# Patient Record
Sex: Male | Born: 1955
Health system: Southern US, Community
[De-identification: ages and names within clinical notes are randomized; demographics above are authoritative.]

## PROBLEM LIST (undated history)

## (undated) DIAGNOSIS — N400 Enlarged prostate without lower urinary tract symptoms: Secondary | ICD-10-CM

## (undated) DIAGNOSIS — F329 Major depressive disorder, single episode, unspecified: Secondary | ICD-10-CM

## (undated) DIAGNOSIS — F988 Other specified behavioral and emotional disorders with onset usually occurring in childhood and adolescence: Secondary | ICD-10-CM

## (undated) DIAGNOSIS — R569 Unspecified convulsions: Secondary | ICD-10-CM

## (undated) DIAGNOSIS — F32A Depression, unspecified: Secondary | ICD-10-CM

## (undated) HISTORY — PX: APPENDECTOMY: SHX54

---

## 2009-02-07 ENCOUNTER — Encounter
Admission: RE | Admit: 2009-02-07 | Discharge: 2009-02-07 | Payer: Self-pay | Admitting: Physical Medicine and Rehabilitation

## 2009-09-14 ENCOUNTER — Observation Stay (HOSPITAL_COMMUNITY): Admission: AD | Admit: 2009-09-14 | Discharge: 2009-09-15 | Payer: Self-pay | Admitting: Internal Medicine

## 2010-09-02 IMAGING — CR DG CHEST 1V
1 series · 1 of 1 positions shown · non-contrast
Comparison: No priors

CLINICAL DATA: Routine physical

CHEST - 1 VIEW

[view not recorded]
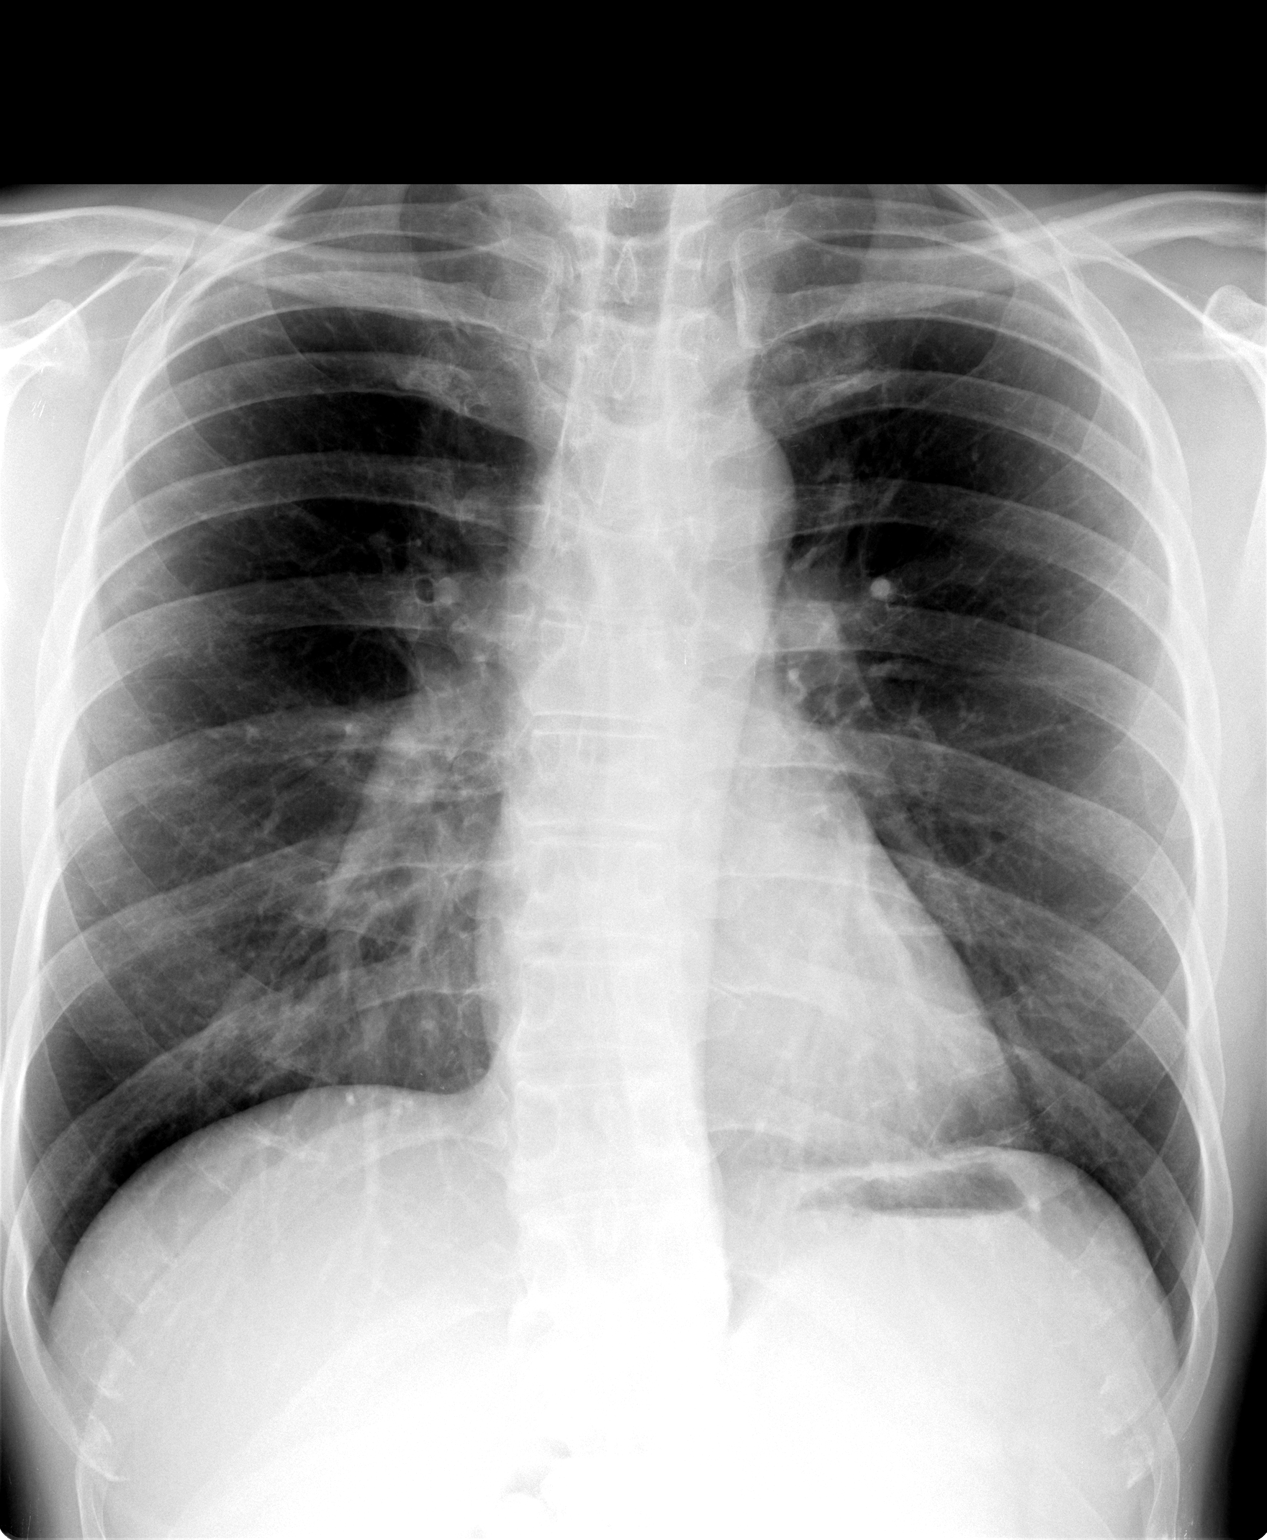

[1 of 1 positions shown; findings below may reference images not displayed]

FINDINGS: Heart and mediastinal contours normal.  Lungs clear.
Osseous structures intact with mild biconcave thoracolumbar
scoliosis.
IMPRESSION: No active disease.

## 2011-03-30 LAB — COMPREHENSIVE METABOLIC PANEL
ALT: 17 U/L (ref 0–53)
AST: 23 U/L (ref 0–37)
Albumin: 3.9 g/dL (ref 3.5–5.2)
Alkaline Phosphatase: 25 U/L — ABNORMAL LOW (ref 39–117)
BUN: 16 mg/dL (ref 6–23)
CO2: 30 mEq/L (ref 19–32)
Calcium: 9 mg/dL (ref 8.4–10.5)
Chloride: 103 mEq/L (ref 96–112)
Creatinine, Ser: 1.06 mg/dL (ref 0.4–1.5)
GFR calc Af Amer: >60 mL/min (ref >60)
GFR calc non Af Amer: >60 mL/min (ref >60)
Glucose, Bld: 117 mg/dL — ABNORMAL HIGH (ref 70–99)
Potassium: 4.1 mEq/L (ref 3.5–5.1)
Sodium: 137 mEq/L (ref 135–145)
Total Bilirubin: 0.6 mg/dL (ref 0.3–1.2)
Total Protein: 6.5 g/dL (ref 6.0–8.3)

## 2011-03-30 LAB — CARDIAC PANEL(CRET KIN+CKTOT+MB+TROPI)
CK, MB: 1.1 ng/mL (ref 0.3–4.0)
CK, MB: 1.1 ng/mL (ref 0.3–4.0)
Relative Index: INVALID (ref 0.0–2.5)
Relative Index: INVALID (ref 0.0–2.5)
Total CK: 62 U/L (ref 7–232)
Total CK: 67 U/L (ref 7–232)
Troponin I: 0.01 ng/mL (ref 0.00–0.06)
Troponin I: 0.01 ng/mL (ref 0.00–0.06)

## 2011-03-30 LAB — CBC
HCT: 34.4 % — ABNORMAL LOW (ref 39.0–52.0)
Hemoglobin: 11.7 g/dL — ABNORMAL LOW (ref 13.0–17.0)
MCHC: 34.1 g/dL (ref 30.0–36.0)
MCV: 98.7 fL (ref 78.0–100.0)
Platelets: 143 10*3/uL — ABNORMAL LOW (ref 150–400)
RBC: 3.48 MIL/uL — ABNORMAL LOW (ref 4.22–5.81)
RDW: 13.2 % (ref 11.5–15.5)
WBC: 4.3 10*3/uL (ref 4.0–10.5)

## 2012-07-10 ENCOUNTER — Emergency Department (HOSPITAL_COMMUNITY)
Admission: EM | Admit: 2012-07-10 | Discharge: 2012-07-10 | Disposition: A | Discharge status: Home / Self Care | Payer: 59 | Attending: Emergency Medicine | Admitting: Emergency Medicine

## 2012-07-10 ENCOUNTER — Encounter (HOSPITAL_COMMUNITY): Payer: Self-pay | Admitting: Family Medicine

## 2012-07-10 DIAGNOSIS — T50905A Adverse effect of unspecified drugs, medicaments and biological substances, initial encounter: Secondary | ICD-10-CM

## 2012-07-10 DIAGNOSIS — T43205A Adverse effect of unspecified antidepressants, initial encounter: Secondary | ICD-10-CM | POA: Insufficient documentation

## 2012-07-10 DIAGNOSIS — R252 Cramp and spasm: Secondary | ICD-10-CM | POA: Insufficient documentation

## 2012-07-10 DIAGNOSIS — F329 Major depressive disorder, single episode, unspecified: Secondary | ICD-10-CM

## 2012-07-10 DIAGNOSIS — Z79899 Other long term (current) drug therapy: Secondary | ICD-10-CM | POA: Insufficient documentation

## 2012-07-10 DIAGNOSIS — T887XXA Unspecified adverse effect of drug or medicament, initial encounter: Secondary | ICD-10-CM | POA: Insufficient documentation

## 2012-07-10 DIAGNOSIS — F419 Anxiety disorder, unspecified: Secondary | ICD-10-CM

## 2012-07-10 DIAGNOSIS — Z9089 Acquired absence of other organs: Secondary | ICD-10-CM | POA: Insufficient documentation

## 2012-07-10 DIAGNOSIS — F32A Depression, unspecified: Secondary | ICD-10-CM

## 2012-07-10 DIAGNOSIS — F341 Dysthymic disorder: Secondary | ICD-10-CM | POA: Insufficient documentation

## 2012-07-10 HISTORY — DX: Other specified behavioral and emotional disorders with onset usually occurring in childhood and adolescence: F98.8

## 2012-07-10 HISTORY — DX: Depression, unspecified: F32.A

## 2012-07-10 HISTORY — DX: Benign prostatic hyperplasia without lower urinary tract symptoms: N40.0

## 2012-07-10 HISTORY — DX: Unspecified convulsions: R56.9

## 2012-07-10 HISTORY — DX: Major depressive disorder, single episode, unspecified: F32.9

## 2012-07-10 NOTE — ED Provider Notes (Signed)
History     CSN: [id number]  Arrival date & time 07/10/12  1710   None     Chief Complaint  Patient presents with  . leg cramping    . Depression  . Anxiety    (Consider location/radiation/quality/duration/timing/severity/associated sxs/prior treatment) HPI Comments: Jeffrey [name] is a 56 y.o. Male who presents with a 2 week history of worsening depression and anxiety.  He has a long history of same and has previously been treated with Celexa and Lexapro.  He sees Oneta Rack, NP at [facility] in [place].  3 weeks ago he was switched from the Lexapro to Prozac 20mg  BID.  He states that his anxiety and depression have increased significantly since then, interfering with his ability to work and interact socially.  He denies SI/HI but feels very frustrated with his significant increase in symptoms.  He is sleeping 16-18 hours per day and has lost his appetite.  He also c/o his arms and legs feeling very heavy and difficulty to move.  He states he has no motivation to get out of bed or do anything during the day.  His ex-wife states that he is much more sullen and withdrawn than usual.     Patient is a 56 y.o. male presenting with anxiety. The history is provided by the patient and medical records (the ex-wife). History Limited By: none.  Anxiety This is a chronic problem. The current episode started 1 to 4 weeks ago. The problem occurs daily. The problem has been rapidly worsening. Associated symptoms include anorexia. Pertinent negatives include no abdominal pain, chest pain, congestion, coughing, diaphoresis, fatigue, fever, headaches, nausea, rash or vomiting. The symptoms are aggravated by stress.    Past Medical History  Diagnosis Date  . Seizures   . Depression   . ADD (attention deficit disorder)   . Benign prostatic hypertrophy     Past Surgical History  Procedure Date  . Appendectomy     History reviewed. No pertinent family history.  History    Substance Use Topics  . Smoking status: Never Smoker   . Smokeless tobacco: Not on file  . Alcohol Use: Yes     rarely      Review of Systems  Constitutional: Positive for activity change (decreased) and appetite change (decreased). Negative for fever, diaphoresis, fatigue and unexpected weight change.  HENT: Negative for hearing loss, ear pain, congestion, mouth sores and voice change.   Eyes: Negative for visual disturbance.  Respiratory: Negative for cough, chest tightness, shortness of breath and wheezing.   Cardiovascular: Negative for chest pain.  Gastrointestinal: Positive for anorexia. Negative for nausea, vomiting, abdominal pain, diarrhea and constipation.  Genitourinary: Negative for dysuria, urgency, frequency and hematuria.  Musculoskeletal: Negative for back pain.  Skin: Negative for rash.  Neurological: Negative for syncope, light-headedness and headaches.  Hematological: Does not bruise/bleed easily.  Psychiatric/Behavioral: Positive for disturbed wake/sleep cycle (sleep 16-18 hrs per day). Negative for suicidal ideas, hallucinations, confusion, self-injury and agitation. The patient is not nervous/anxious.        Anxiety, depression, hopeless feeling, feeling reclusive    Allergies  Review of patient's allergies indicates no known allergies.  Home Medications   Current Outpatient Rx  Name Route Sig Dispense Refill  . AMPHETAMINE-DEXTROAMPHETAMINE 20 MG PO TABS Oral Take 10 mg by mouth 4 (four) times daily.    Marland Kitchen FLUOXETINE HCL 20 MG PO TABS Oral Take 20 mg by mouth 2 (two) times daily.    . ADULT MULTIVITAMIN  W/MINERALS CH Oral Take 1 tablet by mouth daily.    Marland Kitchen PHENYTOIN SODIUM EXTENDED 100 MG PO CAPS Oral Take 100-200 mg by mouth 2 (two) times daily. 1 in am, 2 in pm    . TAMSULOSIN HCL 0.4 MG PO CAPS Oral Take 0.4 mg by mouth daily.      BP 117/74  Pulse 93  Temp 98.1 F (36.7 C) (Oral)  Resp 18  SpO2 99%  Physical Exam  Nursing note and vitals  reviewed. Constitutional: He is oriented to person, place, and time. He appears well-developed and well-nourished. No distress.  HENT:  Head: Normocephalic and atraumatic.  Mouth/Throat: Oropharynx is clear and moist. No oropharyngeal exudate.  Eyes: Conjunctivae and EOM are normal. Pupils are equal, round, and reactive to light. No scleral icterus.  Neck: Normal range of motion. Neck supple.  Cardiovascular: Normal rate, regular rhythm, normal heart sounds and intact distal pulses.   Pulmonary/Chest: Effort normal and breath sounds normal. No respiratory distress. He has no wheezes.  Abdominal: Soft. Bowel sounds are normal. He exhibits no mass. There is no tenderness. There is no rebound and no guarding.  Musculoskeletal: Normal range of motion.  Neurological: He is alert and oriented to person, place, and time.  Skin: Skin is warm and dry. He is not diaphoretic.  Psychiatric: Judgment and thought content normal. His mood appears anxious. His affect is not angry and not labile. His speech is delayed. He is withdrawn. He is not agitated, not aggressive and not actively hallucinating. Thought content is not delusional. He exhibits a depressed mood. He expresses no homicidal and no suicidal ideation. He expresses no suicidal plans and no homicidal plans.       Speech is soft and effortful.  He does not make eye contact during the interview but is cooperative.      ED Course  Procedures (including critical care time)  Labs Reviewed - No data to display No results found.   1. Depression   2. Anxiety   3. Medication adverse effect       MDM  Jeffrey [name] has been stable while in the ER.  He continues to deny SI/HI.  Based on the recent medication change, I feel the Prozac has exacerbated the depression and anxiety symptoms.  He has already scheduled and appointment with Oneta Rack for Tuesday July 14, 2012 and I feel this is the appropriate follow up for medication management.  He  is willing to cut the Prozac dose in 1/2 and take 1 tab QD or 1 tab every other day to avoid any negative side effects from stopping the medication.  I have advised the patient that he needs to return to the ER if the depression worsens or he begins to have SI/HI concerns.  He expresses understanding.          Dahlia Client Ohn Bostic, PA-C 07/10/12 2015

## 2012-07-10 NOTE — ED Notes (Signed)
Hannah, PA at bedside  

## 2012-07-10 NOTE — ED Notes (Signed)
Started on Prozac 3 weeks ago, had been on celexa and lexapro, now states is more anxious and depressed since starting on med. Legs feel heavy and like they are cramping

## 2012-07-10 NOTE — ED Provider Notes (Signed)
Medical screening examination/treatment/procedure(s) were conducted as a shared visit with non-physician practitioner(s) and myself.  I personally evaluated the patient during the encounter.   Pt is alert and cooperative. He denies SI.   Plan is to wean off Prozac and see Psychiatry in 5 days. Return here prn.  Flint Melter, MD 07/10/12 2012

## 2012-07-10 NOTE — ED Notes (Addendum)
Pt reports being started on prozac x3 weeks ago. Reports 2 days ago began having swelling and cramping to both legs.  No swelling noted. Reports it is a "sensation" of swelling. Reports he has been sleeping a lot.  Denies SI/HI.  NAD noted at this time.

## 2012-07-11 NOTE — ED Provider Notes (Signed)
Alert cooperative, and obviously depressed. Patient without suicidal ideation. He stable for discharge.  Medical screening examination/treatment/procedure(s) were conducted as a shared visit with non-physician practitioner(s) and myself.  I personally evaluated the patient during the encounter  Flint Melter, MD 07/11/12 [phone]

## 2013-08-21 ENCOUNTER — Other Ambulatory Visit: Payer: Self-pay | Admitting: Neurology

## 2013-10-26 ENCOUNTER — Encounter (INDEPENDENT_AMBULATORY_CARE_PROVIDER_SITE_OTHER): Payer: Self-pay

## 2013-10-26 ENCOUNTER — Encounter: Payer: Self-pay | Admitting: Nurse Practitioner

## 2013-10-26 ENCOUNTER — Ambulatory Visit (INDEPENDENT_AMBULATORY_CARE_PROVIDER_SITE_OTHER): Payer: 59 | Admitting: Nurse Practitioner

## 2013-10-26 VITALS — BP 121/77 | HR 72 | Ht 74.5 in | Wt 198.0 lb

## 2013-10-26 DIAGNOSIS — Z5181 Encounter for therapeutic drug level monitoring: Secondary | ICD-10-CM

## 2013-10-26 DIAGNOSIS — G40309 Generalized idiopathic epilepsy and epileptic syndromes, not intractable, without status epilepticus: Secondary | ICD-10-CM

## 2013-10-26 MED ORDER — PHENYTOIN SODIUM EXTENDED 100 MG PO CAPS: 300.0000 mg | ORAL_CAPSULE | Freq: Every day | ORAL | Status: DC

## 2013-10-26 NOTE — Progress Notes (Signed)
I have read the note, and I agree with the clinical assessment and plan.  Jeffrey [name],Jeffrey [name]   

## 2013-10-26 NOTE — Patient Instructions (Signed)
Will check labs today Will refill Dilantin for one year Followup yearly and when necessary  Call for any seizure activity

## 2013-10-26 NOTE — Progress Notes (Signed)
[facility]  PATIENT: Jeffrey [name] DOB: April 08, 1956   REASON FOR VISIT: Followup for seizure disorder    HISTORY OF PRESENT ILLNESS:  Mr. Dumlao, 57 year old right-handed white male with a history of seizures and an anxiety disorder. The patient had some issues with anxiety since May of 2013. The patient was switched from  Prozac to Celexa. He sees Donnie Aho every 3 months. The patient indicates that he has done well with the seizures, and he has not had any recurrence in over 16 years. The patient remains on Dilantin, and he is tolerating the medication well without side effects. No problems with his gums.     REVIEW OF SYSTEMS: Full 14 system review of systems performed and notable only for:  Constitutional: N/A  Cardiovascular: N/A  Ear/Nose/Throat: N/A  Skin: N/A  Eyes: N/A  Respiratory: N/A  Gastroitestinal: N/A  Hematology/Lymphatic: N/A  Endocrine: N/A Musculoskeletal:N/A  Allergy/Immunology: N/A  Neurological: N/A Psychiatric: Depression  ALLERGIES: No Known Allergies  HOME MEDICATIONS: Outpatient Prescriptions Prior to Visit  Medication Sig Dispense Refill  . amphetamine-dextroamphetamine (ADDERALL) 20 MG tablet Take 10 mg by mouth 3 (three) times daily.       Marland Kitchen FLUoxetine (PROZAC) 20 MG tablet Take 20 mg by mouth 2 (two) times daily.      . Multiple Vitamin (MULTIVITAMIN WITH MINERALS) TABS Take 1 tablet by mouth daily.      . phenytoin (DILANTIN) 100 MG ER capsule TAKE THREE CAPSULES AT NIGHT  90 capsule  2  . Tamsulosin HCl (FLOMAX) 0.4 MG CAPS Take 0.4 mg by mouth daily.       No facility-administered medications prior to visit.    PAST MEDICAL HISTORY: Past Medical History  Diagnosis Date  . Seizures   . Depression   . ADD (attention deficit disorder)   . Benign prostatic hypertrophy     PAST SURGICAL HISTORY: Past Surgical History  Procedure Laterality Date  . Appendectomy    Knee surgery 2013  FAMILY  HISTORY: Family History  Problem Relation Age of Onset  . Prostate cancer Father   . Seizures Father   . Stroke Sister   . Seizures Son     SOCIAL HISTORY: History   Social History  . Marital Status: Divorced    Spouse Name: N/A    Number of Children: 2  . Years of Education: N/A   Occupational History  .  [employer]   Social History Main Topics  . Smoking status: Former Smoker    Types: Cigarettes  . Smokeless tobacco: Never Used     Comment: Quit 1977  . Alcohol Use: Yes     Comment: rarely  . Drug Use: No  . Sexual Activity: Not on file   Other Topics Concern  . Not on file   Social History Narrative   Patient is divorced and works for [employer].    Education- college   Left handed.   Caffeine- None     PHYSICAL EXAM  Filed Vitals:   10/26/13 0918  BP: 121/77  Pulse: 72  Height: 6' 2.5" (1.892 m)  Weight: 198 lb (89.812 kg)   Body mass index is 25.09 kg/(m^2).  Generalized: Well developed, in no acute distress  Neurological examination   Mentation: Alert oriented to time, place, history taking. Follows all commands speech and language fluent  Cranial nerve II-XII: Pupils were equal round reactive to light extraocular movements were full, visual field were full on  confrontational test. Facial sensation and strength were normal. hearing was intact to finger rubbing bilaterally. Uvula tongue midline. head turning and shoulder shrug and were normal and symmetric.Tongue protrusion into cheek strength was normal. Motor: normal bulk and tone, full strength in the BUE, BLE, fine finger movements normal, no pronator drift. No focal weakness Coordination: finger-nose-finger, heel-to-shin bilaterally, no dysmetria Reflexes: Brachioradialis 2/2, biceps 2/2, triceps 2/2, patellar 2/2, Achilles 2/2, plantar responses were flexor bilaterally. Gait and Station: Rising up from seated position without assistance, normal stance,  moderate stride, good arm  swing, smooth turning, able to perform tiptoe, and heel walking without difficulty. Tandem gait mildly unsteady, Romberg negative  DIAGNOSTIC DATA (LABS, IMAGING, TESTING) -None to review    ASSESSMENT AND PLAN  57 y.o. year old male  has a past medical history of Seizures; Depression; ADD (attention deficit disorder); and Benign prostatic hypertrophy. here to followup for seizure disorder. Currently on Dilantin with no seizures in 16 years.   Will check labs today, CBC, CMP and Dilantin level not trough Will refill Dilantin for one year Followup yearly and when necessary  Call for any seizure activity Nilda Riggs, [facility], [facility], APRN  [facility] [address], [address] [place], [place] [address] [phone]

## 2013-10-27 LAB — CBC WITH DIFFERENTIAL/PLATELET
Eos: 3 %
HCT: 39.8 % (ref 37.5–51.0)
Hemoglobin: 13.7 g/dL (ref 12.6–17.7)
Lymphocytes Absolute: 1.7 10*3/uL (ref 0.7–3.1)
MCHC: 34.4 g/dL (ref 31.5–35.7)
MCV: 94 fL (ref 79–97)
Monocytes Absolute: 0.5 10*3/uL (ref 0.1–0.9)
Monocytes: 9 %
Neutrophils Absolute: 3.1 10*3/uL (ref 1.4–7.0)
RDW: 12.8 % (ref 12.3–15.4)
WBC: 5.5 10*3/uL (ref 3.4–10.8)

## 2013-10-27 LAB — COMPREHENSIVE METABOLIC PANEL
ALT: 23 IU/L (ref 0–44)
AST: 22 IU/L (ref 0–40)
Albumin/Globulin Ratio: 1.9 (ref 1.1–2.5)
Alkaline Phosphatase: 52 IU/L (ref 39–117)
Chloride: 101 mmol/L (ref 96–108)
GFR calc Af Amer: 96 mL/min/{1.73_m2} (ref >59)
GFR calc non Af Amer: 83 mL/min/{1.73_m2} (ref >59)
Glucose: 85 mg/dL (ref 65–99)
Potassium: 4.3 mmol/L (ref 3.5–5.2)
Sodium: 138 mmol/L (ref 134–144)
Total Bilirubin: 0.2 mg/dL (ref 0.0–1.2)

## 2013-10-27 LAB — PHENYTOIN LEVEL, TOTAL: Phenytoin Lvl: 11 ug/mL (ref 10.0–20.0)

## 2013-10-27 NOTE — Progress Notes (Signed)
Quick Note:  Shared lab results with patient thru VM message ______ 

## 2013-10-29 ENCOUNTER — Other Ambulatory Visit: Payer: Self-pay

## 2013-11-20 ENCOUNTER — Other Ambulatory Visit: Payer: Self-pay | Admitting: Neurology

## 2014-09-27 ENCOUNTER — Other Ambulatory Visit: Payer: Self-pay

## 2014-09-27 MED ORDER — PHENYTOIN SODIUM EXTENDED 100 MG PO CAPS: 300.0000 mg | ORAL_CAPSULE | Freq: Every day | ORAL | Status: DC

## 2014-10-26 ENCOUNTER — Ambulatory Visit: Payer: Self-pay | Admitting: Adult Health

## 2014-11-08 ENCOUNTER — Encounter: Payer: Self-pay | Admitting: Adult Health

## 2014-11-08 ENCOUNTER — Ambulatory Visit (INDEPENDENT_AMBULATORY_CARE_PROVIDER_SITE_OTHER): Payer: 59 | Admitting: Adult Health

## 2014-11-08 VITALS — BP 123/78 | HR 84 | Temp 98.0°F | Ht 73.5 in | Wt 202.4 lb

## 2014-11-08 DIAGNOSIS — Z5181 Encounter for therapeutic drug level monitoring: Secondary | ICD-10-CM

## 2014-11-08 DIAGNOSIS — G40309 Generalized idiopathic epilepsy and epileptic syndromes, not intractable, without status epilepticus: Secondary | ICD-10-CM

## 2014-11-08 MED ORDER — PHENYTOIN SODIUM EXTENDED 100 MG PO CAPS: 300.0000 mg | ORAL_CAPSULE | Freq: Every day | ORAL | Status: DC

## 2014-11-08 NOTE — Patient Instructions (Signed)
Seizure, Adult A seizure means there is unusual activity in the brain. A seizure can cause changes in attention or behavior. Seizures often cause shaking (convulsions). Seizures often last from 30 seconds to 2 minutes. HOME CARE   If you are given medicines, take them exactly as told by your doctor.  Keep all doctor visits as told.  Do not swim or drive until your doctor says it is okay.  Teach others what to do if you have a seizure. They should:  Lay you on the ground.  Put a cushion under your head.  Loosen any tight clothing around your neck.  Turn you on your side.  Stay with you until you get better. GET HELP RIGHT AWAY IF:   The seizure lasts longer than 2 to 5 minutes.  The seizure is very bad.  The person does not wake up after the seizure.  The person's attention or behavior changes. Drive the person to the emergency room or call your local emergency services (911 in [place] MAKE SURE YOU:   Understand these instructions.  Will watch your condition.  Will get help right away if you are not doing well or get worse. Document Released: 05/28/2008 Document Revised: 03/03/2012 Document Reviewed: 11/28/2011 Exit[employer] Patient Information 2015 [employer]. This information is not intended to replace advice given to you by your health care provider. Make sure you discuss any questions you have with your health care provider.  

## 2014-11-08 NOTE — Progress Notes (Signed)
I have read the note, and I agree with the clinical assessment and plan.  WILLIS,CHARLES KEITH   

## 2014-11-08 NOTE — Progress Notes (Signed)
PATIENT: Jeffrey [name] DOB: 03-23-1956  REASON FOR VISIT: follow up HISTORY FROM: patient  HISTORY OF PRESENT ILLNESS: Jeffrey [name] is a 58 year old male with a history of seizures. He returns today for follow-up. He is currently taking Dilantin and tolerated well. He denies any recent seizures. His last seizure was over 20 year ago.  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 He is able to complete all ADLs independently. Denies any problems with his gait or balance. States that he always has trouble walking heel-to-toe. Denies any new neurological complaints. No new medical issues since last seen. 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 He reports that his anxiety and depression are controlled.   HISTORY 10/26/13 (CM): 58 year old right-handed white male with a history of seizures and an anxiety disorder. The patient had some issues with anxiety since May of 2013. The patient was switched from Prozac to Celexa. He sees Jeffrey [name] every 3 months. The patient indicates that he has done well with the seizures, and he has not had any recurrence in over 16 years. The patient remains on Dilantin, and he is tolerating the medication well without side effects. No problems with his gums.     REVIEW OF SYSTEMS: Full 14 system review of systems performed and notable only for:  Constitutional: N/A  Eyes: N/A Ear/Nose/Throat: N/A  Skin: N/A  Cardiovascular: N/A  Respiratory: N/A  Gastrointestinal: N/A  Genitourinary: N/A Hematology/Lymphatic: N/A  Endocrine: N/A Musculoskeletal:N/A  Allergy/Immunology: N/A  Neurological: N/A Psychiatric: N/A Sleep: shift work   ALLERGIES: No Known Allergies  HOME MEDICATIONS: Outpatient Prescriptions Prior to Visit  Medication Sig Dispense Refill  . amphetamine-dextroamphetamine (ADDERALL) 20 MG tablet Take 10 mg by mouth 3 (three) times daily.     . Multiple Vitamin (MULTIVITAMIN WITH MINERALS) TABS Take 1 tablet by mouth daily.    .  phenytoin (DILANTIN) 100 MG ER capsule Take 3 capsules (300 mg total) by mouth daily. 90 capsule 0  . Tamsulosin HCl (FLOMAX) 0.4 MG CAPS Take 0.4 mg by mouth 2 (two) times daily.     . citalopram (CELEXA) 40 MG tablet Take 20 mg by mouth daily. One tablet 20mg  one daily.     No facility-administered medications prior to visit.    PAST MEDICAL HISTORY: Past Medical History  Diagnosis Date  . Seizures   . Depression   . ADD (attention deficit disorder)   . Benign prostatic hypertrophy     PAST SURGICAL HISTORY: Past Surgical History  Procedure Laterality Date  . Appendectomy      FAMILY HISTORY: Family History  Problem Relation Age of Onset  . Prostate cancer Father   . Seizures Father   . Stroke Sister   . Seizures Son     SOCIAL HISTORY: History   Social History  . Marital Status: Divorced    Spouse Name: N/A    Number of Children: 2  . Years of Education: N/A   Occupational History  .  [employer]   Social History Main Topics  . Smoking status: Former Smoker    Types: Cigarettes  . Smokeless tobacco: Never Used     Comment: Quit 1977  . Alcohol Use: Yes     Comment: rarely  . Drug Use: No  . Sexual Activity: Not on file   Other Topics Concern  . Not on file   Social History Narrative   Patient is divorced and works for [employer].  Education- college   Left handed.   Caffeine- None      PHYSICAL EXAM  Filed Vitals:   11/08/14 1457  BP: 123/78  Pulse: 84  Temp: 98 F (36.7 C)  TempSrc: Oral  Height: 6' 1.5" (1.867 m)  Weight: 202 lb 6.4 oz (91.808 kg)   Body mass index is 26.34 kg/(m^2).  Generalized: Well developed, in no acute distress   Neurological examination  Mentation: Alert oriented to time, place, history taking. Follows all commands speech and language fluent Cranial nerve II-XII: Pupils were equal round reactive to light. Extraocular movements were full, visual field were full on confrontational test. Facial  sensation and strength were normal. Uvula tongue midline. Head turning and shoulder shrug  were normal and symmetric. Motor: The motor testing reveals 5 over 5 strength of all 4 extremities. Good symmetric motor tone is noted throughout.  Sensory: Sensory testing is intact to soft touch on all 4 extremities. No evidence of extinction is noted.  Coordination: Cerebellar testing reveals good finger-nose-finger and heel-to-shin bilaterally.  Gait and station: Gait is normal. Tandem gait is unsteady. Romberg is negative. No drift is seen.  Reflexes: Deep tendon reflexes are symmetric and normal bilaterally.    DIAGNOSTIC DATA (LABS, IMAGING, TESTING) - I reviewed patient records, labs, notes, testing and imaging myself where available.  Lab Results  Component Value Date   WBC 5.5 10/26/2013   HGB 13.7 10/26/2013   HCT 39.8 10/26/2013   MCV 94 10/26/2013   PLT 143* 09/14/2009      Component Value Date/Time   NA 138 10/26/2013 0955   NA 137 09/14/2009 1505   K 4.3 10/26/2013 0955   CL 101 10/26/2013 0955   CO2 29 10/26/2013 0955   GLUCOSE 85 10/26/2013 0955   GLUCOSE 117* 09/14/2009 1505   BUN 17 10/26/2013 0955   BUN 16 09/14/2009 1505   CREATININE 1.01 10/26/2013 0955   CALCIUM 9.1 10/26/2013 0955   PROT 7.0 10/26/2013 0955   PROT 6.5 09/14/2009 1505   ALBUMIN 3.9 09/14/2009 1505   AST 22 10/26/2013 0955   ALT 23 10/26/2013 0955   ALKPHOS 52 10/26/2013 0955   BILITOT 0.2 10/26/2013 0955   GFRNONAA 83 10/26/2013 0955   GFRAA 96 10/26/2013 0955      ASSESSMENT AND PLAN 58 y.o. year old male  has a past medical history of Seizures; Depression; ADD (attention deficit disorder); and Benign prostatic hypertrophy. here with:   1. Seizures  The patient's seizures have been controlled on Dilantin.  He will continue taking Dilantin 300 mg daily, I will refill this today. I will also check blood work-CBC, CMP, Dilantin level The patient has any additional seizures he should let  us know. He will follow up in your sooner if needed.     Jeffrey Givens, MSN, NP-C 11/08/2014, 3:12 PM [facility] [address] [place], [place] [address] [phone]  Note: This document was prepared with digital dictation and possible smart phrase technology. Any transcriptional errors that result from this process are unintentional.

## 2014-11-09 LAB — COMPREHENSIVE METABOLIC PANEL
A/G RATIO: 1.7 (ref 1.1–2.5)
ALBUMIN: 4.7 g/dL (ref 3.5–5.5)
ALT: 17 IU/L (ref 0–44)
AST: 19 IU/L (ref 0–40)
Alkaline Phosphatase: 52 IU/L (ref 39–117)
BILIRUBIN TOTAL: 0.3 mg/dL (ref 0.0–1.2)
BUN/Creatinine Ratio: 13 (ref 9–20)
BUN: 12 mg/dL (ref 6–24)
CO2: 25 mmol/L (ref 18–29)
Calcium: 9.7 mg/dL (ref 8.7–10.2)
Chloride: 100 mmol/L (ref 97–108)
Creatinine, Ser: 0.95 mg/dL (ref 0.76–1.27)
GFR, EST AFRICAN AMERICAN: 102 mL/min/{1.73_m2} (ref >59)
GFR, EST NON AFRICAN AMERICAN: 88 mL/min/{1.73_m2} (ref >59)
GLUCOSE: 91 mg/dL (ref 65–99)
Globulin, Total: 2.7 g/dL (ref 1.5–4.5)
Potassium: 4.7 mmol/L (ref 3.5–5.2)
Sodium: 141 mmol/L (ref 134–144)
TOTAL PROTEIN: 7.4 g/dL (ref 6.0–8.5)

## 2014-11-09 LAB — CBC WITH DIFFERENTIAL
BASOS ABS: 0 10*3/uL (ref 0.0–0.2)
BASOS: 0 %
Eos: 2 %
Eosinophils Absolute: 0.1 10*3/uL (ref 0.0–0.4)
HEMATOCRIT: 43.3 % (ref 37.5–51.0)
Hemoglobin: 14.8 g/dL (ref 12.6–17.7)
Immature Grans (Abs): 0 10*3/uL (ref 0.0–0.1)
Immature Granulocytes: 0 %
LYMPHS ABS: 1.4 10*3/uL (ref 0.7–3.1)
Lymphs: 28 %
MCH: 31.7 pg (ref 26.6–33.0)
MCHC: 34.2 g/dL (ref 31.5–35.7)
MCV: 93 fL (ref 79–97)
MONOCYTES: 7 %
Monocytes Absolute: 0.4 10*3/uL (ref 0.1–0.9)
NEUTROS ABS: 3.2 10*3/uL (ref 1.4–7.0)
Neutrophils Relative %: 63 %
Platelets: 193 10*3/uL (ref 150–379)
RBC: 4.67 x10E6/uL (ref 4.14–5.80)
RDW: 13.6 % (ref 12.3–15.4)
WBC: 5.1 10*3/uL (ref 3.4–10.8)

## 2014-11-09 LAB — PHENYTOIN LEVEL, TOTAL: Phenytoin Lvl: 7.6 ug/mL — ABNORMAL LOW (ref 10.0–20.0)

## 2014-11-11 ENCOUNTER — Encounter: Payer: Self-pay | Admitting: *Deleted

## 2014-12-14 ENCOUNTER — Other Ambulatory Visit: Payer: Self-pay | Admitting: Neurology

## 2015-11-09 ENCOUNTER — Encounter: Payer: Self-pay | Admitting: Adult Health

## 2015-11-09 ENCOUNTER — Ambulatory Visit (INDEPENDENT_AMBULATORY_CARE_PROVIDER_SITE_OTHER): Payer: 59 | Admitting: Adult Health

## 2015-11-09 VITALS — BP 125/77 | HR 87 | Ht 73.5 in | Wt 207.5 lb

## 2015-11-09 DIAGNOSIS — Z5181 Encounter for therapeutic drug level monitoring: Secondary | ICD-10-CM

## 2015-11-09 DIAGNOSIS — Z79899 Other long term (current) drug therapy: Secondary | ICD-10-CM | POA: Diagnosis not present

## 2015-11-09 DIAGNOSIS — G40309 Generalized idiopathic epilepsy and epileptic syndromes, not intractable, without status epilepticus: Secondary | ICD-10-CM | POA: Diagnosis not present

## 2015-11-09 NOTE — Progress Notes (Signed)
[name]: Jeffrey [name] DOB: 18-Jul-1956  REASON FOR VISIT: follow up- seizure HISTORY FROM: [name]  HISTORY OF PRESENT ILLNESS:  Jeffrey [name] is a 59 year old male with a history of seizures. He returns today for follow-up. He continues to take Dilantin 100 mg in Jeffrey morning and 200 mg in Jeffrey evening. He is tolerating Jeffrey medication well. He states that he has not had a seizure in 20 years. He states that he is able to complete all ADLs independently. 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 He states that he does have trouble walking heel to toe. He states that recently he has been under more stress. In 04-24-23 his father passed away. His mother is now sick and his siblings are having to care for her. He states that he has been having episodes since he was 18 that began with ringing in Jeffrey ear and then he will have trouble speaking this may last 5-45 seconds. Jeffrey [name] states that in 04-24-1995 he had ringing in Jeffrey ears that led to a generalized seizure. Jeffrey [name] states that he usually has these episodes with increased stress. Jeffrey [name] states that he doesn't want to increased Dilantin if he does not have to. He denies any new neurological issues. He returns today for evaluation.  HISTORY 11/08/14: Jeffrey [name] is a 59 year old male with a history of seizures. He returns today for follow-up. He is currently taking Dilantin and tolerated well. He denies any recent seizures. His last seizure was over 20 year ago. 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 He is able to complete all ADLs independently. Denies any problems with his gait or balance. States that he always has trouble walking heel-to-toe. Denies any new neurological complaints. No new medical issues since last seen. 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 He reports that his anxiety and depression are controlled.   HISTORY 10/26/13 (CM): 59 year old right-handed white male with a history  of seizures and an anxiety disorder. Jeffrey [name] had some issues with anxiety since May of 2013. Jeffrey [name] was switched from Prozac to Celexa. He sees [name] every 3 months. Jeffrey [name] indicates that he has done well with Jeffrey seizures, and he has not had any recurrence in over 16 years. Jeffrey [name] remains on Dilantin, and he is tolerating Jeffrey medication well without side effects. No problems with his gums.   REVIEW OF SYSTEMS: Out of a complete 14 system review of symptoms, Jeffrey [name] complains only of Jeffrey following symptoms, and all other reviewed systems are negative.  ALLERGIES: No Known Allergies  HOME MEDICATIONS: Outpatient Prescriptions Prior to Visit  Medication Sig Dispense Refill  . amphetamine-dextroamphetamine (ADDERALL) 20 MG tablet Take 10 mg by mouth 3 (three) times daily.     . citalopram (CELEXA) 20 MG tablet Take 20 mg by mouth daily.    . Multiple Vitamin (MULTIVITAMIN WITH MINERALS) TABS Take 1 tablet by mouth daily.    . phenytoin (DILANTIN) 100 MG ER capsule Take 3 capsules (300 mg total) by mouth daily. 90 capsule 11  . Tamsulosin HCl (FLOMAX) 0.4 MG CAPS Take 0.4 mg by mouth 2 (two) times daily.     . phenytoin (DILANTIN) 100 MG ER capsule TAKE 3 CAPSULES (300 MG TOTAL) BY MOUTH DAILY. 90 capsule 11   No facility-administered medications prior to visit.    PAST MEDICAL HISTORY: Past Medical History  Diagnosis Date  . Seizures ([place])   .  Depression   . ADD (attention deficit disorder)   . Benign prostatic hypertrophy     PAST SURGICAL HISTORY: Past Surgical History  Procedure Laterality Date  . Appendectomy      FAMILY HISTORY: Family History  Problem Relation Age of Onset  . Prostate cancer Father   . Seizures Father   . Stroke Sister   . Seizures Son   . Heart Problems Mother     SOCIAL HISTORY: Social History   Social History  . Marital Status: Divorced    Spouse Name: N/A  . Number of Children: 2  . Years of Education: N/A    Occupational History  .  [employer]   Social History Main Topics  . Smoking status: Former Smoker    Types: Cigarettes  . Smokeless tobacco: Never Used     Comment: Quit 1977  . Alcohol Use: Yes     Comment: rarely  . Drug Use: No  . Sexual Activity: Not on file   Other Topics Concern  . Not on file   Social History Narrative   [name] is divorced and works for [employer].    Education- college   Left handed.   Caffeine- None      PHYSICAL EXAM  Filed Vitals:   11/09/15 0928  BP: 125/77  Pulse: 87  Height: 6' 1.5" (1.867 m)  Weight: 207 lb 8 oz (94.121 kg)   Body mass index is 27 kg/(m^2).  Generalized: Well developed, in no acute distress   Neurological examination  Mentation: Alert oriented to time, place, history taking. Follows all commands speech and language fluent Cranial nerve II-XII: Pupils were equal round reactive to light. Extraocular movements were full, visual field were full on confrontational test. Facial sensation and strength were normal. Uvula tongue midline. Head turning and shoulder shrug  were normal and symmetric. Motor: Jeffrey motor testing reveals 5 over 5 strength of all 4 extremities. Good symmetric motor tone is noted throughout.  Sensory: Sensory testing is intact to soft touch on all 4 extremities. No evidence of extinction is noted.  Coordination: Cerebellar testing reveals good finger-nose-finger and heel-to-shin bilaterally.  Gait and station: Gait is normal. Tandem gait is  Unsteady. Romberg is negative. No drift is seen.  Reflexes: Deep tendon reflexes are symmetric and normal bilaterally.   DIAGNOSTIC DATA (LABS, IMAGING, TESTING) - I reviewed [name] records, labs, notes, testing and imaging myself where available.  Lab Results  Component Value Date   WBC 5.1 11/08/2014   HGB 14.8 11/08/2014   HCT 43.3 11/08/2014   MCV 93 11/08/2014   PLT 193 11/08/2014      Component Value Date/Time   NA 141 11/08/2014  1532   NA 137 09/14/2009 1505   K 4.7 11/08/2014 1532   CL 100 11/08/2014 1532   CO2 25 11/08/2014 1532   GLUCOSE 91 11/08/2014 1532   GLUCOSE 117* 09/14/2009 1505   BUN 12 11/08/2014 1532   BUN 16 09/14/2009 1505   CREATININE 0.95 11/08/2014 1532   CALCIUM 9.7 11/08/2014 1532   PROT 7.4 11/08/2014 1532   PROT 6.5 09/14/2009 1505   ALBUMIN 4.7 11/08/2014 1532   ALBUMIN 3.9 09/14/2009 1505   AST 19 11/08/2014 1532   ALT 17 11/08/2014 1532   ALKPHOS 52 11/08/2014 1532   BILITOT 0.3 11/08/2014 1532   GFRNONAA 88 11/08/2014 1532   GFRAA 102 11/08/2014 1532     ASSESSMENT AND PLAN 59 y.o. year old male  has a  past medical history of Seizures ([place]); Depression; ADD (attention deficit disorder); and Benign prostatic hypertrophy. here with:   1. Seizures   Overall Jeffrey [name] is doing well. He did take his Dilantin dose this morning. For that reason he will come in one day this week and have his blood drawn before his first dose of Dilantin. Pending his Dilantin  Level his dose may need to be increased due to these episodes that include ringing in Jeffrey ear and not being able to speak for several seconds. [name] verbalized understanding. [name] advised that if he has any seizure events he should let us know. He will follow-up in one year or sooner if needed.  Ward Givens, MSN, NP-C 11/09/2015, 10:04 AM [facility] [address], [place], [place] [address] [phone]

## 2015-11-09 NOTE — Progress Notes (Signed)
I have read the note, and I agree with the clinical assessment and plan.  Spiro Ausborn KEITH   

## 2015-11-09 NOTE — Patient Instructions (Signed)
Continue Dilantin Come back in for blood work  Pending dilantin level we may increase your dose. If you have any seizure events please let us know. If your symptoms worsen or you develop new symptoms please let us know.

## 2015-11-10 ENCOUNTER — Other Ambulatory Visit (INDEPENDENT_AMBULATORY_CARE_PROVIDER_SITE_OTHER): Payer: Self-pay

## 2015-11-10 DIAGNOSIS — Z0289 Encounter for other administrative examinations: Secondary | ICD-10-CM

## 2015-11-10 DIAGNOSIS — Z79899 Other long term (current) drug therapy: Principal | ICD-10-CM

## 2015-11-10 DIAGNOSIS — Z5181 Encounter for therapeutic drug level monitoring: Secondary | ICD-10-CM

## 2015-11-11 LAB — COMPREHENSIVE METABOLIC PANEL
A/G RATIO: 1.7 (ref 1.1–2.5)
ALT: 29 IU/L (ref 0–44)
AST: 24 IU/L (ref 0–40)
Albumin: 4.5 g/dL (ref 3.5–5.5)
Alkaline Phosphatase: 46 IU/L (ref 39–117)
BILIRUBIN TOTAL: 0.4 mg/dL (ref 0.0–1.2)
BUN/Creatinine Ratio: 14 (ref 9–20)
BUN: 14 mg/dL (ref 6–24)
CALCIUM: 9.8 mg/dL (ref 8.7–10.2)
CO2: 28 mmol/L (ref 18–29)
Chloride: 97 mmol/L (ref 97–106)
Creatinine, Ser: 1.03 mg/dL (ref 0.76–1.27)
GFR, EST AFRICAN AMERICAN: 92 mL/min/{1.73_m2} (ref >59)
GFR, EST NON AFRICAN AMERICAN: 80 mL/min/{1.73_m2} (ref >59)
GLOBULIN, TOTAL: 2.6 g/dL (ref 1.5–4.5)
Glucose: 118 mg/dL — ABNORMAL HIGH (ref 65–99)
Potassium: 4.4 mmol/L (ref 3.5–5.2)
Sodium: 138 mmol/L (ref 136–144)
TOTAL PROTEIN: 7.1 g/dL (ref 6.0–8.5)

## 2015-11-11 LAB — CBC WITH DIFFERENTIAL/PLATELET
BASOS: 0 %
Basophils Absolute: 0 10*3/uL (ref 0.0–0.2)
EOS (ABSOLUTE): 0.2 10*3/uL (ref 0.0–0.4)
EOS: 4 %
HEMATOCRIT: 42.6 % (ref 37.5–51.0)
HEMOGLOBIN: 14.5 g/dL (ref 12.6–17.7)
IMMATURE GRANS (ABS): 0 10*3/uL (ref 0.0–0.1)
IMMATURE GRANULOCYTES: 0 %
LYMPHS: 35 %
Lymphocytes Absolute: 1.9 10*3/uL (ref 0.7–3.1)
MCH: 32.4 pg (ref 26.6–33.0)
MCHC: 34 g/dL (ref 31.5–35.7)
MCV: 95 fL (ref 79–97)
MONOCYTES: 7 %
Monocytes Absolute: 0.4 10*3/uL (ref 0.1–0.9)
NEUTROS PCT: 54 %
Neutrophils Absolute: 2.9 10*3/uL (ref 1.4–7.0)
PLATELETS: 186 10*3/uL (ref 150–379)
RBC: 4.48 x10E6/uL (ref 4.14–5.80)
RDW: 13.5 % (ref 12.3–15.4)
WBC: 5.3 10*3/uL (ref 3.4–10.8)

## 2015-11-11 LAB — PHENYTOIN LEVEL, TOTAL: Phenytoin (Dilantin), Serum: 9.9 ug/mL — ABNORMAL LOW (ref 10.0–20.0)

## 2015-11-14 ENCOUNTER — Telehealth: Payer: Self-pay | Admitting: Adult Health

## 2015-11-14 MED ORDER — PHENYTOIN 50 MG PO CHEW: 50.0000 mg | CHEWABLE_TABLET | Freq: Every day | ORAL | Status: DC

## 2015-11-14 NOTE — Telephone Encounter (Signed)
The patient's Dilantin level is low. It has been low in the past. I called the patient and made him aware of his results. However at the last visit he mentioned that he was having episodes that started with ringing in the ears and then followed by not be able to speak for several seconds. In the past he's had ringing in the ears that was followed by generalized seizure. For that reason I will increase his Dilantin by 50 mg. He will now take a total of 350 mg daily. We will recheck his Dilantin level in one month. He will let me know if the additional dose of Dilantin is beneficial for these episodes.

## 2016-01-09 ENCOUNTER — Other Ambulatory Visit: Payer: Self-pay | Admitting: Neurology

## 2016-01-17 ENCOUNTER — Other Ambulatory Visit: Payer: Self-pay | Admitting: Adult Health

## 2016-03-09 ENCOUNTER — Other Ambulatory Visit: Payer: Self-pay | Admitting: Neurology

## 2016-03-12 ENCOUNTER — Telehealth: Payer: Self-pay | Admitting: Adult Health

## 2016-03-12 NOTE — Telephone Encounter (Signed)
I called the patient. He is requesting a refill on his Dilantin. I'm called to verify that he increased his dose to 350 mg. If so he also needs to come in for a repeat Dilantin level. I have asked the patient to call at his convenience.

## 2016-03-13 ENCOUNTER — Other Ambulatory Visit: Payer: Self-pay | Admitting: Adult Health

## 2016-03-13 DIAGNOSIS — Z5181 Encounter for therapeutic drug level monitoring: Secondary | ICD-10-CM

## 2016-03-13 DIAGNOSIS — Z79899 Other long term (current) drug therapy: Principal | ICD-10-CM

## 2016-03-13 NOTE — Telephone Encounter (Signed)
Pt returned Megan's call. °

## 2016-03-13 NOTE — Telephone Encounter (Signed)
I called the patient. He reports that he is taking Dilantin 350 mg daily. He states that the ringing in his ears has decided since we increased the medication. The patient does need to come in for [place] to repeat his Dilantin level. I will put order in today. He can come in and his convenience. He was advised that he should hold his morning dose until after he has lab work. Would be best if he can come in one morning at 8 AM.

## 2016-04-15 ENCOUNTER — Other Ambulatory Visit: Payer: Self-pay | Admitting: Adult Health

## 2016-04-18 ENCOUNTER — Other Ambulatory Visit (INDEPENDENT_AMBULATORY_CARE_PROVIDER_SITE_OTHER): Payer: Self-pay

## 2016-04-18 DIAGNOSIS — Z5181 Encounter for therapeutic drug level monitoring: Secondary | ICD-10-CM

## 2016-04-18 DIAGNOSIS — Z79899 Other long term (current) drug therapy: Principal | ICD-10-CM

## 2016-04-18 DIAGNOSIS — Z0289 Encounter for other administrative examinations: Secondary | ICD-10-CM

## 2016-04-19 LAB — PHENYTOIN LEVEL, TOTAL: PHENYTOIN (DILANTIN), SERUM: 17.8 ug/mL (ref 10.0–20.0)

## 2016-04-23 ENCOUNTER — Telehealth: Payer: Self-pay | Admitting: Adult Health

## 2016-04-23 NOTE — Progress Notes (Signed)
Quick Note:  Called and left patient a message and relayed labs were ok. ______

## 2016-04-23 NOTE — Telephone Encounter (Signed)
Called and,left message for patient and relayed labs were ok. Relayed to patient if any concerns please give me a call back.

## 2016-04-23 NOTE — Telephone Encounter (Signed)
-----   Message from Ward Givens, NP sent at 04/19/2016  7:23 AM EDT ----- Lab work ok. Please call patient.

## 2016-07-16 ENCOUNTER — Other Ambulatory Visit: Payer: Self-pay | Admitting: Adult Health

## 2016-08-31 ENCOUNTER — Other Ambulatory Visit: Payer: Self-pay | Admitting: Adult Health

## 2016-09-20 ENCOUNTER — Telehealth: Payer: Self-pay | Admitting: Neurology

## 2016-09-20 NOTE — Telephone Encounter (Signed)
Pharmacy changed as requested.

## 2016-09-20 NOTE — Telephone Encounter (Signed)
Patient called to notify of pharmacy change from [facility] to [facility] [place]

## 2016-10-08 ENCOUNTER — Telehealth: Payer: Self-pay | Admitting: Adult Health

## 2016-10-08 MED ORDER — PHENYTOIN SODIUM EXTENDED 100 MG PO CAPS: 300.0000 mg | ORAL_CAPSULE | Freq: Every day | ORAL | 1 refills | Status: DC

## 2016-10-08 NOTE — Telephone Encounter (Signed)
Patient called to request new prescription for phenytoin (DILANTIN) 100 MG ER capsule, states pharmacy has changed from [facility] to [facility].

## 2016-11-08 ENCOUNTER — Ambulatory Visit (INDEPENDENT_AMBULATORY_CARE_PROVIDER_SITE_OTHER): Payer: 59 | Admitting: Adult Health

## 2016-11-08 ENCOUNTER — Encounter: Payer: Self-pay | Admitting: Adult Health

## 2016-11-08 VITALS — BP 121/77 | HR 84 | Ht 73.75 in | Wt 201.0 lb

## 2016-11-08 DIAGNOSIS — Z79899 Other long term (current) drug therapy: Secondary | ICD-10-CM | POA: Diagnosis not present

## 2016-11-08 DIAGNOSIS — R569 Unspecified convulsions: Secondary | ICD-10-CM

## 2016-11-08 DIAGNOSIS — Z5181 Encounter for therapeutic drug level monitoring: Secondary | ICD-10-CM

## 2016-11-08 MED ORDER — PHENYTOIN SODIUM EXTENDED 100 MG PO CAPS: 300.0000 mg | ORAL_CAPSULE | Freq: Every day | ORAL | 11 refills | Status: DC

## 2016-11-08 MED ORDER — PHENYTOIN 50 MG PO CHEW: CHEWABLE_TABLET | ORAL | 3 refills | Status: DC

## 2016-11-08 NOTE — Patient Instructions (Signed)
Continue Dilantin Blood work Bone Denisty scan If your symptoms worsen or you develop new symptoms please let us know.

## 2016-11-08 NOTE — Progress Notes (Signed)
PATIENT: Jeffrey [name] DOB: 04-09-1956  REASON FOR VISIT: follow up-seizures HISTORY FROM: patient  HISTORY OF PRESENT ILLNESS: [name] is a 60 year old male with a history of seizures. He returns today for follow-up. He is currently on Dilantin 350 mg daily. He reports this is working well for him. He denies any seizure events. He is no longer having ringing in ears. Occasionally does have trouble with his thought process. Denies any trouble with his gait or balance. 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 Denies any new neurological symptoms. He returns today for an evaluation.   HISTORY 11/09/15: [name] is a 60 year old male with a history of seizures. He returns today for follow-up. He continues to take Dilantin 100 mg in the morning and 200 mg in the evening. He is tolerating the medication well. He states that he has not had a seizure in 20 years. He states that he is able to complete all ADLs independently. 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 He states that he does have trouble walking heel to toe. He states that recently he has been under more stress. In 11-May-2023 his father passed away. His mother is now sick and his siblings are having to care for her. He states that he has been having episodes since he was 18 that began with ringing in the ear and then he will have trouble speaking this may last 5-45 seconds. The patient states that in 1996 he had ringing in the ears that led to a generalized seizure. The patient states that he usually has these episodes with increased stress. The patient states that he doesn't want to increased Dilantin if he does not have to. He denies any new neurological issues. He returns today for evaluation.  HISTORY 11/08/14: [name] is a 60 year old male with a history of seizures. He returns today for follow-up. He is currently taking  Dilantin and tolerated well. He denies any recent seizures. His last seizure was over 20 year ago. 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 He is able to complete all ADLs independently. Denies any problems with his gait or balance. States that he always has trouble walking heel-to-toe. Denies any new neurological complaints. No new medical issues since last seen. 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 He reports that his anxiety and depression are controlled.   HISTORY 10/26/13 (CM): 60 year old right-handed white male with a history of seizures and an anxiety disorder. The patient had some issues with anxiety since May of 2013. The patient was switched from Prozac to Celexa. He sees [name] every 3 months. The patient indicates that he has done well with the seizures, and he has not had any recurrence in over 16 years. The patient remains on Dilantin, and he is tolerating the medication well without side effects. No problems with his gums.    REVIEW OF SYSTEMS: Out of a complete 14 system review of symptoms, the patient complains only of the following symptoms, and all other reviewed systems are negative.  See history of present illness  ALLERGIES: No Known Allergies  HOME MEDICATIONS: Outpatient Medications Prior to Visit  Medication Sig Dispense Refill  . amphetamine-dextroamphetamine (ADDERALL) 20 MG tablet Take 10 mg by mouth 3 (three) times daily.     . citalopram (CELEXA) 40 MG tablet Take 1 tablet by mouth daily as needed.  1  .  Multiple Vitamin (MULTIVITAMIN WITH MINERALS) TABS Take 1 tablet by mouth daily.    . phenytoin (DILANTIN) 100 MG ER capsule Take 3 capsules (300 mg total) by mouth daily. Keep appt 11-08-16 (Patient taking differently: Take 300 mg by mouth daily. Keep appt 11-08-16) 90 capsule 1  . PHENYTOIN INFATABS 50 MG tablet CHEW AND SWALLOW 1 TABLET(50 MG) BY MOUTH DAILY 90 tablet 1  . Tamsulosin HCl (FLOMAX) 0.4 MG CAPS Take 0.4 mg by mouth 2 (two)  times daily.     . citalopram (CELEXA) 20 MG tablet Take 20 mg by mouth daily.     No facility-administered medications prior to visit.     PAST MEDICAL HISTORY: Past Medical History:  Diagnosis Date  . ADD (attention deficit disorder)   . Benign prostatic hypertrophy   . Depression   . Seizures ([place])     PAST SURGICAL HISTORY: Past Surgical History:  Procedure Laterality Date  . APPENDECTOMY      FAMILY HISTORY: Family History  Problem Relation Age of Onset  . Prostate cancer Father   . Seizures Father   . Heart Problems Mother   . Stroke Sister   . Seizures Son     SOCIAL HISTORY: Social History   Social History  . Marital status: Divorced    Spouse name: N/A  . Number of children: 2  . Years of education: N/A   Occupational History  .  [employer]   Social History Main Topics  . Smoking status: Former Smoker    Types: Cigarettes  . Smokeless tobacco: Never Used     Comment: Quit 1977  . Alcohol use Yes     Comment: rarely  . Drug use: No  . Sexual activity: Not on file   Other Topics Concern  . Not on file   Social History Narrative   Patient is divorced and works for [employer].    Education- college   Left handed.   Caffeine- None      PHYSICAL EXAM  Vitals:   11/08/16 0924  BP: 121/77  Pulse: 84  Weight: 201 lb (91.2 kg)  Height: 6' 1.75" (1.873 m)   Body mass index is 25.98 kg/m.  Generalized: Well developed, in no acute distress   Neurological examination  Mentation: Alert oriented to time, place, history taking. Follows all commands speech and language fluent Cranial nerve II-XII: Pupils were equal round reactive to light. Extraocular movements were full, visual field were full on confrontational test. Facial sensation and strength were normal. Uvula tongue midline. Head turning and shoulder shrug  were normal and symmetric. Motor: The motor testing reveals 5 over 5 strength of all 4 extremities. Good symmetric  motor tone is noted throughout.  Sensory: Sensory testing is intact to soft touch on all 4 extremities. No evidence of extinction is noted.  Coordination: Cerebellar testing reveals good finger-nose-finger and heel-to-shin bilaterally.  Gait and station: Gait is normal. Tandem gait is normal. Romberg is negative. No drift is seen.  Reflexes: Deep tendon reflexes are symmetric and normal bilaterally.   DIAGNOSTIC DATA (LABS, IMAGING, TESTING) - I reviewed patient records, labs, notes, testing and imaging myself where available.  Lab Results  Component Value Date   WBC 5.3 11/10/2015   HGB 14.8 11/08/2014   HCT 42.6 11/10/2015   MCV 95 11/10/2015   PLT 186 11/10/2015      Component Value Date/Time   NA 138 11/10/2015 0828   K 4.4 11/10/2015 [id number]  CL 97 11/10/2015 0828   CO2 28 11/10/2015 0828   GLUCOSE 118 (H) 11/10/2015 0828   GLUCOSE 117 (H) 09/14/2009 1505   BUN 14 11/10/2015 0828   CREATININE 1.03 11/10/2015 0828   CALCIUM 9.8 11/10/2015 0828   PROT 7.1 11/10/2015 0828   ALBUMIN 4.5 11/10/2015 0828   AST 24 11/10/2015 0828   ALT 29 11/10/2015 0828   ALKPHOS 46 11/10/2015 0828   BILITOT 0.4 11/10/2015 0828   GFRNONAA 80 11/10/2015 0828   GFRAA 92 11/10/2015 0828      ASSESSMENT AND PLAN 60 y.o. year old male  has a past medical history of ADD (attention deficit disorder); Benign prostatic hypertrophy; Depression; and Seizures ([place]). here with:  1. Seizures  Overall the patient is doing well. He will continue on Dilantin 350 mg daily. The patient has been on Dilantin since the 1970s. We will send him for a bone density scan. We will also check blood work. Patient advised that if his symptoms worsen or seizure events he should let us know. He will follow-up in one year with Dr. Jannifer Franklin.     Ward Givens, MSN, NP-C 11/08/2016, 9:32 AM [facility] [address] [place], [place] [address] [phone]

## 2016-11-08 NOTE — Progress Notes (Signed)
I have read the note, and I agree with the clinical assessment and plan.  Jeffrey [name],Jeffrey [name]   

## 2016-11-09 ENCOUNTER — Other Ambulatory Visit (INDEPENDENT_AMBULATORY_CARE_PROVIDER_SITE_OTHER): Payer: Self-pay

## 2016-11-09 DIAGNOSIS — Z5181 Encounter for therapeutic drug level monitoring: Secondary | ICD-10-CM

## 2016-11-09 DIAGNOSIS — Z79899 Other long term (current) drug therapy: Principal | ICD-10-CM

## 2016-11-09 DIAGNOSIS — Z0289 Encounter for other administrative examinations: Secondary | ICD-10-CM

## 2016-11-10 LAB — COMPREHENSIVE METABOLIC PANEL
A/G RATIO: 1.8 (ref 1.2–2.2)
ALT: 30 IU/L (ref 0–44)
AST: 26 IU/L (ref 0–40)
Albumin: 4.9 g/dL (ref 3.5–5.5)
Alkaline Phosphatase: 52 IU/L (ref 39–117)
BUN/Creatinine Ratio: 12 (ref 9–20)
BUN: 12 mg/dL (ref 6–24)
Bilirubin Total: 0.3 mg/dL (ref 0.0–1.2)
CALCIUM: 9.6 mg/dL (ref 8.7–10.2)
CO2: 27 mmol/L (ref 18–29)
CREATININE: 1.02 mg/dL (ref 0.76–1.27)
Chloride: 100 mmol/L (ref 96–106)
GFR, EST AFRICAN AMERICAN: 93 mL/min/{1.73_m2} (ref >59)
GFR, EST NON AFRICAN AMERICAN: 80 mL/min/{1.73_m2} (ref >59)
GLUCOSE: 87 mg/dL (ref 65–99)
Globulin, Total: 2.8 g/dL (ref 1.5–4.5)
Potassium: 4.7 mmol/L (ref 3.5–5.2)
Sodium: 142 mmol/L (ref 134–144)
TOTAL PROTEIN: 7.7 g/dL (ref 6.0–8.5)

## 2016-11-10 LAB — CBC WITH DIFFERENTIAL/PLATELET
BASOS: 0 %
Basophils Absolute: 0 10*3/uL (ref 0.0–0.2)
EOS (ABSOLUTE): 0.2 10*3/uL (ref 0.0–0.4)
EOS: 4 %
HEMOGLOBIN: 14.8 g/dL (ref 12.6–17.7)
Hematocrit: 42.2 % (ref 37.5–51.0)
IMMATURE GRANS (ABS): 0 10*3/uL (ref 0.0–0.1)
IMMATURE GRANULOCYTES: 0 %
LYMPHS: 32 %
Lymphocytes Absolute: 1.8 10*3/uL (ref 0.7–3.1)
MCH: 32.7 pg (ref 26.6–33.0)
MCHC: 35.1 g/dL (ref 31.5–35.7)
MCV: 93 fL (ref 79–97)
MONOCYTES: 8 %
Monocytes Absolute: 0.4 10*3/uL (ref 0.1–0.9)
NEUTROS ABS: 3 10*3/uL (ref 1.4–7.0)
Neutrophils: 56 %
Platelets: 195 10*3/uL (ref 150–379)
RBC: 4.53 x10E6/uL (ref 4.14–5.80)
RDW: 14.1 % (ref 12.3–15.4)
WBC: 5.4 10*3/uL (ref 3.4–10.8)

## 2016-11-10 LAB — PHENYTOIN LEVEL, TOTAL: PHENYTOIN (DILANTIN), SERUM: 20 ug/mL (ref 10.0–20.0)

## 2016-11-12 ENCOUNTER — Telehealth: Payer: Self-pay | Admitting: *Deleted

## 2016-11-12 NOTE — Telephone Encounter (Signed)
Per Edman Circle, NP, LVM informing patient his lab results are unremarkable. Left name, number for any questions.

## 2016-12-23 ENCOUNTER — Emergency Department (HOSPITAL_COMMUNITY)
Admission: EM | Admit: 2016-12-23 | Discharge: 2016-12-23 | Disposition: A | Discharge status: Home / Self Care | Payer: 59 | Attending: Emergency Medicine | Admitting: Emergency Medicine

## 2016-12-23 ENCOUNTER — Encounter (HOSPITAL_COMMUNITY): Payer: Self-pay | Admitting: *Deleted

## 2016-12-23 DIAGNOSIS — Z87891 Personal history of nicotine dependence: Secondary | ICD-10-CM | POA: Diagnosis not present

## 2016-12-23 DIAGNOSIS — F909 Attention-deficit hyperactivity disorder, unspecified type: Secondary | ICD-10-CM | POA: Insufficient documentation

## 2016-12-23 DIAGNOSIS — Z79899 Other long term (current) drug therapy: Secondary | ICD-10-CM | POA: Insufficient documentation

## 2016-12-23 DIAGNOSIS — R319 Hematuria, unspecified: Secondary | ICD-10-CM | POA: Diagnosis not present

## 2016-12-23 DIAGNOSIS — Z7982 Long term (current) use of aspirin: Secondary | ICD-10-CM | POA: Diagnosis not present

## 2016-12-23 LAB — URINALYSIS, ROUTINE W REFLEX MICROSCOPIC
Bacteria, UA: NONE SEEN
Bilirubin Urine: NEGATIVE
Glucose, UA: NEGATIVE mg/dL
Ketones, ur: NEGATIVE mg/dL
Leukocytes, UA: NEGATIVE
Nitrite: NEGATIVE
Protein, ur: 30 mg/dL — AB
Specific Gravity, Urine: 1.021 (ref 1.005–1.030)
Squamous Epithelial / LPF: NONE SEEN
pH: 6 (ref 5.0–8.0)

## 2016-12-23 NOTE — Discharge Instructions (Signed)
Please schedule appointment with urologist in 2 days for follow-up and cystoscopy. Drink plenty of fluids. Take your daily medications as prescribed. Your urine will be cultured in case there is any evidence of urinary tract infection. You will be called in several days and given an antibiotic medication and if there is presence of bacteria.   Get help right away if: You develop severe vomiting and are unable to keep the medicine down. You develop severe back or abdominal pain despite taking your medicines. You begin passing a large amount of blood or clots in your urine. You feel extremely weak or faint, or you pass out.

## 2016-12-23 NOTE — ED Triage Notes (Signed)
Pt complains of blood from his urethra since last night. Pt states he has a constant "trickle" of blood, even when he is not urinating. Pt is taking Flomax for BPH. Pt denies pain or odor.

## 2016-12-23 NOTE — ED Notes (Signed)
Pt in bathroom trying to urinate at this time.

## 2016-12-23 NOTE — ED Provider Notes (Signed)
[facility] DEPT Provider Note   CSN: [id number] Arrival date & time: 12/23/16  1101     History   Chief Complaint Chief Complaint  Patient presents with  . Hematuria    HPI Jeffrey [name] is a 60 y.o. male with history of BPH presents with hematuria since last night. Patient reports 3 episodes of "trickling" of blood through his urethra, that is not painful, and not associated with urination. Patient states that this is the first time this ever happened to him. Patient states that he had a small clot of blood out of his urethra today in the ED around 1 hour ago, stating that is the first time he has a clot.  Patient reports being diagnosed with BPH and taking Flomax for about 6 or 7 years now. Patient reports having these symptoms after having sexual intercourse with his wife of over 20 years last night. Patient reports the only change in medication he's had recently is a baby aspirin and red yeast Rice in September. Patient reports smoking for a total of 2 years in high school and college however has not smoked since then. Patient reports being seen by the urologist frequently but has not been seen this year because he was given the option to see urologist or not and treatment has been symptomatic to this point. Patient denies any other sexual partners. Patient denies chest pain, shortness of breath, abdominal pain, fevers, chills, nausea, vomiting, dysuria, trauma, recent surgeries, or any other urinary symptoms.  HPI  Past Medical History:  Diagnosis Date  . ADD (attention deficit disorder)   . Benign prostatic hypertrophy   . Depression   . Seizures [facility])     Patient Active Problem List   Diagnosis Date Noted  . Generalized convulsive epilepsy ([place]) 10/26/2013  . Encounter for therapeutic drug monitoring 10/26/2013    Past Surgical History:  Procedure Laterality Date  . APPENDECTOMY         Home Medications    Prior to Admission medications   Medication Sig  Start Date End Date Taking? Authorizing Provider  amphetamine-dextroamphetamine (ADDERALL) 20 MG tablet Take 10 mg by mouth 3 (three) times daily.    Yes Historical Provider, MD  aspirin EC 81 MG tablet Take 81 mg by mouth daily.   Yes Historical Provider, MD  citalopram (CELEXA) 40 MG tablet Take 1 tablet by mouth daily as needed. 08/19/15  Yes Historical Provider, MD  Multiple Vitamin (MULTIVITAMIN WITH MINERALS) TABS Take 1 tablet by mouth daily.   Yes Historical Provider, MD  phenytoin (DILANTIN) 100 MG ER capsule Take 3 capsules (300 mg total) by mouth daily. 11/08/16  Yes Ward Givens, NP  phenytoin (PHENYTOIN INFATABS) 50 MG tablet CHEW AND SWALLOW 1 TABLET(50 MG) BY MOUTH DAILY 11/08/16  Yes Ward Givens, NP  Red Yeast Rice Extract 600 MG TABS Take 1 tablet by mouth daily.   Yes Historical Provider, MD  Tamsulosin HCl (FLOMAX) 0.4 MG CAPS Take 0.4 mg by mouth 2 (two) times daily.    Yes Historical Provider, MD    Family History Family History  Problem Relation Age of Onset  . Prostate cancer Father   . Seizures Father   . Heart Problems Mother   . Stroke Sister   . Seizures Son     Social History Social History  Substance Use Topics  . Smoking status: Former Smoker    Types: Cigarettes  . Smokeless tobacco: Never Used     Comment: Quit 1977  .  Alcohol use Yes     Comment: rarely     Allergies   Patient has no known allergies.   Review of Systems Review of Systems  Constitutional: Negative for chills and fever.  HENT: Negative for sore throat.   Eyes: Negative for visual disturbance.  Respiratory: Negative for chest tightness and shortness of breath.   Gastrointestinal: Negative for abdominal pain, constipation, diarrhea, nausea and vomiting.  Genitourinary: Positive for hematuria. Negative for difficulty urinating, dysuria, flank pain, frequency, penile pain and urgency.  Musculoskeletal: Negative for back pain and gait problem.  Skin: Negative for wound.    Neurological: Negative for dizziness, syncope, light-headedness and numbness.  Hematological: Does not bruise/bleed easily.     Physical Exam Updated Vital Signs BP 127/81   Pulse 69   Temp 97.8 F (36.6 C) (Oral)   Resp 16   Ht 6\' 1"  (1.854 m)   Wt 83.9 kg   SpO2 94%   BMI 24.41 kg/m   Physical Exam  Constitutional: He is oriented to person, place, and time. He appears well-developed and well-nourished.  HENT:  Head: Normocephalic and atraumatic.  Nose: Nose normal.  Mouth/Throat: Oropharynx is clear and moist.  Eyes: Conjunctivae and EOM are normal. Pupils are equal, round, and reactive to light.  Neck: Normal range of motion. Neck supple.  Cardiovascular: Normal rate and normal heart sounds.   Pulmonary/Chest: Effort normal and breath sounds normal. No respiratory distress. He exhibits no tenderness.  Abdominal: Soft. Bowel sounds are normal. There is tenderness in the suprapubic area. There is no rigidity, no rebound, no guarding, no CVA tenderness, no tenderness at McBurney's point and negative Murphy's sign.  Musculoskeletal: Normal range of motion.  Neurological: He is alert and oriented to person, place, and time.  Skin: Skin is warm. Capillary refill takes less than 2 seconds. No pallor.  Psychiatric: He has a normal mood and affect. His behavior is normal.  Nursing note and vitals reviewed.    ED Treatments / Results  Labs (all labs ordered are listed, but only abnormal results are displayed) Labs Reviewed  URINALYSIS, ROUTINE W REFLEX MICROSCOPIC - Abnormal; Notable for the following:       Result Value   Color, Urine AMBER (*)    APPearance CLOUDY (*)    Hgb urine dipstick LARGE (*)    Protein, ur 30 (*)    All other components within normal limits  URINE CULTURE    EKG  EKG Interpretation None       Radiology No results found.  Procedures Procedures (including critical care time)  Medications Ordered in ED Medications - No data to  display   Initial Impression / Assessment and Plan / ED Course  I have reviewed the triage vital signs and the nursing notes.  Pertinent labs & imaging results that were available during my care of the patient were reviewed by me and considered in my medical decision making (see chart for details).  Clinical Course   Patient is a 60 y/o male presenting for hematuria since last night. He states he has had about 3-4 episodes of "trinkling" of blood since yesterday without association to urination. On exam patient afebrile, VSS, in no apparent distress. Abdomen soft and mildly tender to suprapubic area. Negative murphy sign. No focal tenderness to McBurney's point. Urinalysis showed no obvious signs of infection, but will culture urine for possible cystitis. Low suspicion for pyelonephritis and kidney stones. Patient already taking flomax and is asymptomatic at this time. Patient  encouraged to schedule appointment with urologist in 2 days for follow up cystoscopy. Patient understood. Patient agreed with assessment and plan. Patient afebrile, VSS, NAD prior to discharge. Return precautions given for any new or worsening symptoms.  Discussed patient case with Dr. Wilson Singer.   Final Clinical Impressions(s) / ED Diagnoses   Final diagnoses:  Hematuria, unspecified type    New Prescriptions Discharge Medication List as of 12/23/2016  2:57 PM       [place], [place] 12/23/16 1717    Virgel Manifold, MD 12/27/16 1330

## 2016-12-24 LAB — URINE CULTURE: Culture: NO GROWTH

## 2016-12-31 DIAGNOSIS — N401 Enlarged prostate with lower urinary tract symptoms: Secondary | ICD-10-CM | POA: Diagnosis not present

## 2016-12-31 DIAGNOSIS — R310 Gross hematuria: Secondary | ICD-10-CM | POA: Diagnosis not present

## 2017-01-02 ENCOUNTER — Other Ambulatory Visit: Payer: Self-pay | Admitting: Neurology

## 2017-02-05 ENCOUNTER — Telehealth: Payer: Self-pay | Admitting: Adult Health

## 2017-02-05 NOTE — Telephone Encounter (Signed)
I called and spoke to [place] at Pharmacy.  The [employer] manufacturer for phenytoin  100ng ER that pt has been taking is in short supply.  [employer] is the one they are requesting to use.  Is this ok?   I called pt and he has not had to change as far as he recalls, but is willing to do this.

## 2017-02-05 NOTE — Telephone Encounter (Signed)
Called and spoke to Jeffrey [name] at pharmacy.  Ok'd change.  Made pt aware of changing meds can put him at risk for sz event.  He verbalized understanding.

## 2017-02-05 NOTE — Telephone Encounter (Signed)
If they are short supply then we will have to approve the patient can't go without medication. Changing manufactures can put the patient at risk for seizure event. Please make him aware.

## 2017-02-05 NOTE — Telephone Encounter (Signed)
[facility] [phone] called said the manufacture they normally use for medication is in short supply, they need to change  to a different manufacturer and need approval. Please call

## 2017-02-15 DIAGNOSIS — R310 Gross hematuria: Secondary | ICD-10-CM | POA: Diagnosis not present

## 2017-02-15 DIAGNOSIS — N401 Enlarged prostate with lower urinary tract symptoms: Secondary | ICD-10-CM | POA: Diagnosis not present

## 2017-07-24 DIAGNOSIS — Z0000 Encounter for general adult medical examination without abnormal findings: Secondary | ICD-10-CM | POA: Diagnosis not present

## 2017-07-24 DIAGNOSIS — E7800 Pure hypercholesterolemia, unspecified: Secondary | ICD-10-CM | POA: Diagnosis not present

## 2017-08-27 DIAGNOSIS — E7800 Pure hypercholesterolemia, unspecified: Secondary | ICD-10-CM | POA: Diagnosis not present

## 2017-08-29 DIAGNOSIS — E7800 Pure hypercholesterolemia, unspecified: Secondary | ICD-10-CM | POA: Diagnosis not present

## 2017-08-29 DIAGNOSIS — H9313 Tinnitus, bilateral: Secondary | ICD-10-CM | POA: Diagnosis not present

## 2017-11-08 ENCOUNTER — Ambulatory Visit: Payer: 59 | Admitting: Neurology

## 2017-11-08 ENCOUNTER — Encounter: Payer: Self-pay | Admitting: Neurology

## 2017-11-08 ENCOUNTER — Other Ambulatory Visit: Payer: Self-pay

## 2017-11-08 VITALS — BP 125/87 | HR 86 | Ht 73.0 in | Wt 197.0 lb

## 2017-11-08 DIAGNOSIS — Z5181 Encounter for therapeutic drug level monitoring: Secondary | ICD-10-CM

## 2017-11-08 DIAGNOSIS — G40309 Generalized idiopathic epilepsy and epileptic syndromes, not intractable, without status epilepticus: Secondary | ICD-10-CM | POA: Diagnosis not present

## 2017-11-08 MED ORDER — PHENYTOIN SODIUM EXTENDED 100 MG PO CAPS: 300.0000 mg | ORAL_CAPSULE | Freq: Every day | ORAL | 3 refills | Status: DC

## 2017-11-08 MED ORDER — PHENYTOIN 50 MG PO CHEW: CHEWABLE_TABLET | ORAL | 3 refills | Status: DC

## 2017-11-08 NOTE — Progress Notes (Signed)
Reason for visit: Seizures  Jeffrey [name] is an 60 y.o. male  History of present illness:  Jeffrey [name] is a 61 year old right-handed white male with a history of seizures that have been under excellent control, the last seizure was in the late 1990s.  The patient is on Dilantin taking 350 mg at night.  He is retired, he sleeps well.  He denies any problems with drowsiness or gait instability on the Dilantin.  He returns to the office today for an evaluation.  Past Medical History:  Diagnosis Date  . ADD (attention deficit disorder)   . Benign prostatic hypertrophy   . Depression   . Seizures ([place])     Past Surgical History:  Procedure Laterality Date  . APPENDECTOMY      Family History  Problem Relation Age of Onset  . Prostate cancer Father   . Seizures Father   . Heart Problems Mother   . Stroke Sister   . Seizures Son     Social history:  reports that he has quit smoking. His smoking use included cigarettes. he has never used smokeless tobacco. He reports that he drinks alcohol. He reports that he does not use drugs.   No Known Allergies  Medications:  Prior to Admission medications   Medication Sig Start Date End Date Taking? Authorizing Provider  amphetamine-dextroamphetamine (ADDERALL) 20 MG tablet Take 10 mg by mouth 3 (three) times daily.    Yes [provider]  atorvastatin (LIPITOR) 20 MG tablet Take 20 mg at bedtime by mouth. 10/23/17  Yes [provider]  citalopram (CELEXA) 40 MG tablet Take 1 tablet by mouth daily as needed. 08/19/15  Yes [provider]  Multiple Vitamin (MULTIVITAMIN WITH MINERALS) TABS Take 1 tablet by mouth daily.   Yes [provider]  phenytoin (DILANTIN) 100 MG ER capsule Take 3 capsules (300 mg total) by mouth daily. 11/08/16  Yes Ward Givens, NP  phenytoin (PHENYTOIN INFATABS) 50 MG tablet CHEW AND SWALLOW 1 TABLET(50 MG) BY MOUTH DAILY 11/08/16  Yes Ward Givens, NP  Tamsulosin HCl  (FLOMAX) 0.4 MG CAPS Take 0.4 mg by mouth 2 (two) times daily.    Yes [provider]    ROS:  Out of a complete 14 system review of symptoms, the patient complains only of the following symptoms, and all other reviewed systems are negative.  Difficulty urinating History of seizures  Blood pressure 125/87, pulse 86, height 6\' 1"  (1.854 m), weight 197 lb (89.4 kg).  Physical Exam  General: The patient is alert and cooperative at the time of the examination.  Skin: No significant peripheral edema is noted.   Neurologic Exam  Mental status: The patient is alert and oriented x 3 at the time of the examination. The patient has apparent normal recent and remote memory, with an apparently normal attention span and concentration ability.   Cranial nerves: Facial symmetry is present. Speech is normal, no aphasia or dysarthria is noted. Extraocular movements are full. Visual fields are full.  Pupils are equal, round, and reactive to light.  Discs are flat bilaterally.  Motor: The patient has good strength in all 4 extremities.  Sensory examination: Soft touch sensation is symmetric on the face, arms, and legs.  Coordination: The patient has good finger-nose-finger and heel-to-shin bilaterally.  Gait and station: The patient has a normal gait. Tandem gait is unsteady. Romberg is negative. No drift is seen.  Reflexes: Deep tendon reflexes are symmetric.   Assessment/Plan:  1.  History of seizures, well controlled  The patient will undergo blood work today, he was given prescriptions for the 50 mg and 100 mg dosing of the Dilantin.  He will follow-up in 1 year, sooner if needed.  He will go on vitamin D supplementation, 1000 international units daily.  Jill Alexanders MD 11/08/2017 10:19 AM  [facility] [address] [place], [place] [address]  Phone [phone] Fax [phone]

## 2017-11-09 LAB — COMPREHENSIVE METABOLIC PANEL
ALT: 28 IU/L (ref 0–44)
AST: 26 IU/L (ref 0–40)
Albumin/Globulin Ratio: 1.9 (ref 1.2–2.2)
Albumin: 4.8 g/dL (ref 3.6–4.8)
Alkaline Phosphatase: 49 IU/L (ref 39–117)
BUN/Creatinine Ratio: 16 (ref 10–24)
BUN: 16 mg/dL (ref 8–27)
Bilirubin Total: 0.4 mg/dL (ref 0.0–1.2)
CALCIUM: 9.9 mg/dL (ref 8.6–10.2)
CO2: 30 mmol/L — AB (ref 20–29)
CREATININE: 1.03 mg/dL (ref 0.76–1.27)
Chloride: 100 mmol/L (ref 96–106)
GFR calc Af Amer: 91 mL/min/{1.73_m2} (ref >59)
GFR, EST NON AFRICAN AMERICAN: 79 mL/min/{1.73_m2} (ref >59)
GLOBULIN, TOTAL: 2.5 g/dL (ref 1.5–4.5)
Glucose: 80 mg/dL (ref 65–99)
Potassium: 4.7 mmol/L (ref 3.5–5.2)
SODIUM: 141 mmol/L (ref 134–144)
TOTAL PROTEIN: 7.3 g/dL (ref 6.0–8.5)

## 2017-11-09 LAB — CBC WITH DIFFERENTIAL/PLATELET
BASOS: 0 %
Basophils Absolute: 0 10*3/uL (ref 0.0–0.2)
EOS (ABSOLUTE): 0.2 10*3/uL (ref 0.0–0.4)
EOS: 4 %
HEMATOCRIT: 40.6 % (ref 37.5–51.0)
HEMOGLOBIN: 13.6 g/dL (ref 13.0–17.7)
IMMATURE GRANS (ABS): 0 10*3/uL (ref 0.0–0.1)
IMMATURE GRANULOCYTES: 0 %
LYMPHS: 32 %
Lymphocytes Absolute: 1.7 10*3/uL (ref 0.7–3.1)
MCH: 32.5 pg (ref 26.6–33.0)
MCHC: 33.5 g/dL (ref 31.5–35.7)
MCV: 97 fL (ref 79–97)
MONOS ABS: 0.4 10*3/uL (ref 0.1–0.9)
Monocytes: 8 %
Neutrophils Absolute: 2.9 10*3/uL (ref 1.4–7.0)
Neutrophils: 56 %
Platelets: 185 10*3/uL (ref 150–379)
RBC: 4.19 x10E6/uL (ref 4.14–5.80)
RDW: 13.5 % (ref 12.3–15.4)
WBC: 5.2 10*3/uL (ref 3.4–10.8)

## 2017-11-09 LAB — PHENYTOIN LEVEL, TOTAL: Phenytoin (Dilantin), Serum: 15.9 ug/mL (ref 10.0–20.0)

## 2017-11-27 DIAGNOSIS — D126 Benign neoplasm of colon, unspecified: Secondary | ICD-10-CM | POA: Diagnosis not present

## 2017-11-27 DIAGNOSIS — Z1211 Encounter for screening for malignant neoplasm of colon: Secondary | ICD-10-CM | POA: Diagnosis not present

## 2017-11-27 DIAGNOSIS — K621 Rectal polyp: Secondary | ICD-10-CM | POA: Diagnosis not present

## 2017-12-07 ENCOUNTER — Encounter (HOSPITAL_COMMUNITY): Payer: Self-pay | Admitting: Emergency Medicine

## 2017-12-07 ENCOUNTER — Emergency Department (HOSPITAL_COMMUNITY)
Admission: EM | Admit: 2017-12-07 | Discharge: 2017-12-07 | Disposition: A | Discharge status: Home / Self Care | Payer: 59 | Attending: Emergency Medicine | Admitting: Emergency Medicine

## 2017-12-07 DIAGNOSIS — Y9389 Activity, other specified: Secondary | ICD-10-CM | POA: Insufficient documentation

## 2017-12-07 DIAGNOSIS — Z79899 Other long term (current) drug therapy: Secondary | ICD-10-CM | POA: Diagnosis not present

## 2017-12-07 DIAGNOSIS — S61209A Unspecified open wound of unspecified finger without damage to nail, initial encounter: Secondary | ICD-10-CM | POA: Insufficient documentation

## 2017-12-07 DIAGNOSIS — Y999 Unspecified external cause status: Secondary | ICD-10-CM | POA: Insufficient documentation

## 2017-12-07 DIAGNOSIS — W270XXA Contact with workbench tool, initial encounter: Secondary | ICD-10-CM | POA: Diagnosis not present

## 2017-12-07 DIAGNOSIS — S6991XA Unspecified injury of right wrist, hand and finger(s), initial encounter: Secondary | ICD-10-CM | POA: Diagnosis present

## 2017-12-07 DIAGNOSIS — Z87891 Personal history of nicotine dependence: Secondary | ICD-10-CM | POA: Insufficient documentation

## 2017-12-07 DIAGNOSIS — Z23 Encounter for immunization: Secondary | ICD-10-CM | POA: Diagnosis not present

## 2017-12-07 DIAGNOSIS — S61011A Laceration without foreign body of right thumb without damage to nail, initial encounter: Secondary | ICD-10-CM | POA: Diagnosis not present

## 2017-12-07 DIAGNOSIS — Y929 Unspecified place or not applicable: Secondary | ICD-10-CM | POA: Insufficient documentation

## 2017-12-07 DIAGNOSIS — F909 Attention-deficit hyperactivity disorder, unspecified type: Secondary | ICD-10-CM | POA: Insufficient documentation

## 2017-12-07 DIAGNOSIS — S61001A Unspecified open wound of right thumb without damage to nail, initial encounter: Secondary | ICD-10-CM | POA: Diagnosis not present

## 2017-12-07 MED ORDER — TETANUS-DIPHTH-ACELL PERTUSSIS 5-2.5-18.5 LF-MCG/0.5 IM SUSP
0.5000 mL | Freq: Once | INTRAMUSCULAR | Status: AC
  Administered 2017-12-07: 0.5 mL via INTRAMUSCULAR
  Filled 2017-12-07: qty 0.5

## 2017-12-07 MED ORDER — LIDOCAINE HCL (PF) 1 % IJ SOLN
INTRAMUSCULAR | Status: AC
  Filled 2017-12-07: qty 5

## 2017-12-07 MED ORDER — LIDOCAINE HCL (PF) 1 % IJ SOLN
5.0000 mL | Freq: Once | INTRAMUSCULAR | Status: AC
  Administered 2017-12-07: 5 mL via INTRADERMAL

## 2017-12-07 NOTE — ED Provider Notes (Signed)
[facility] EMERGENCY DEPARTMENT Provider Note   CSN: [id number] Arrival date & time: 12/07/17  0016     History   Chief Complaint Chief Complaint  Patient presents with  . Laceration    HPI   Blood pressure 126/68, pulse 88, temperature 98 F (36.7 C), temperature source Oral, resp. rate 18, SpO2 100 %.  Jeffrey [name] is a 61 y.o. male complaining of laceration to dorsum of right thumb sustained when using a hand saw just prior to arrival.  Last tetanus shot is unknown, pain is severe and bleeding has been difficult to control.  He is not anticoagulated, he denies any numbness, weakness, reduced range of motion.  Patient is left-hand dominant.   Past Medical History:  Diagnosis Date  . ADD (attention deficit disorder)   . Benign prostatic hypertrophy   . Depression   . Seizures [facility])     Patient Active Problem List   Diagnosis Date Noted  . Generalized convulsive epilepsy ([place]) 10/26/2013  . Encounter for therapeutic drug monitoring 10/26/2013    Past Surgical History:  Procedure Laterality Date  . APPENDECTOMY         Home Medications    Prior to Admission medications   Medication Sig Start Date End Date Taking? Authorizing Provider  amphetamine-dextroamphetamine (ADDERALL) 20 MG tablet Take 10 mg by mouth 3 (three) times daily.     [provider]  atorvastatin (LIPITOR) 20 MG tablet Take 20 mg at bedtime by mouth. 10/23/17   [provider]  cholecalciferol (VITAMIN D) 1000 units tablet Take 1,000 Units daily by mouth.    [provider]  citalopram (CELEXA) 40 MG tablet Take 1 tablet by mouth daily as needed. 08/19/15   [provider]  Multiple Vitamin (MULTIVITAMIN WITH MINERALS) TABS Take 1 tablet by mouth daily.    [provider]  phenytoin (DILANTIN) 100 MG ER capsule Take 3 capsules (300 mg total) daily by mouth. 11/08/17   Kathrynn Ducking, MD  phenytoin (PHENYTOIN INFATABS) 50 MG  tablet CHEW AND SWALLOW 1 TABLET(50 MG) BY MOUTH DAILY 11/08/17   Kathrynn Ducking, MD  Tamsulosin HCl (FLOMAX) 0.4 MG CAPS Take 0.4 mg by mouth 2 (two) times daily.     [provider]    Family History Family History  Problem Relation Age of Onset  . Prostate cancer Father   . Seizures Father   . Heart Problems Mother   . Stroke Sister   . Seizures Son     Social History Social History   Tobacco Use  . Smoking status: Former Smoker    Types: Cigarettes  . Smokeless tobacco: Never Used  . Tobacco comment: Quit 1977  Substance Use Topics  . Alcohol use: Yes    Comment: rarely  . Drug use: No     Allergies   Patient has no known allergies.   Review of Systems Review of Systems  A complete review of systems was obtained and all systems are negative except as noted in the HPI and PMH.   Physical Exam Updated Vital Signs BP 122/80   Pulse 80   Temp 98 F (36.7 C) (Oral)   Resp 19   SpO2 100%   Physical Exam  Constitutional: He is oriented to person, place, and time. He appears well-developed and well-nourished.  HENT:  Head: Normocephalic and atraumatic.  Mouth/Throat: Oropharynx is clear and moist.  Eyes: Conjunctivae and EOM are normal. Pupils are equal, round, and reactive  to light.  Neck: Normal range of motion. Neck supple.  Cardiovascular: Normal rate, regular rhythm and intact distal pulses.  Pulmonary/Chest: Effort normal and breath sounds normal. No respiratory distress. He has no wheezes. He has no rales. He exhibits no tenderness.  Abdominal: Soft. Bowel sounds are normal. He exhibits no distension and no mass. There is no tenderness. There is no rebound and no guarding.  No Seatbelt Sign  Musculoskeletal: Normal range of motion. He exhibits no edema or tenderness.  Full-thickness avulsion to the dorsum of left thumb at the interphalangeal joint, this does not penetrate full-thickness  Neurological: He is alert and oriented to person,  place, and time.  Skin: Skin is warm.  Psychiatric: He has a normal mood and affect.  Nursing note and vitals reviewed.    ED Treatments / Results  Labs (all labs ordered are listed, but only abnormal results are displayed) Labs Reviewed - No data to display  EKG  EKG Interpretation None       Radiology No results found.  Procedures .Marland KitchenLaceration Repair Date/Time: 12/07/2017 5:39 AM Performed by: Monico Blitz, PA-C Authorized by: Monico Blitz, PA-C   Consent:    Consent obtained:  Verbal   Consent given by:  Patient Anesthesia (see MAR for exact dosages):    Anesthesia method:  None Laceration details:    Location:  Finger   Finger location:  R thumb Repair type:    Repair type:  Simple Pre-procedure details:    Preparation:  Patient was prepped and draped in usual sterile fashion Exploration:    Hemostasis achieved with:  Tourniquet   Wound exploration: wound explored through full range of motion     Contaminated: no   Treatment:    Area cleansed with:  Saline   Amount of cleaning:  Standard   Irrigation solution:  Sterile saline   Irrigation method:  Syringe Skin repair:    Repair method:  Tissue adhesive Post-procedure details:    Patient tolerance of procedure:  Tolerated well, no immediate complications   (including critical care time)  Medications Ordered in ED Medications  Tdap (BOOSTRIX) injection 0.5 mL (0.5 mLs Intramuscular Given 12/07/17 0503)  lidocaine (PF) (XYLOCAINE) 1 % injection 5 mL (5 mLs Intradermal Given 12/07/17 0505)     Initial Impression / Assessment and Plan / ED Course  I have reviewed the triage vital signs and the nursing notes.  Pertinent labs & imaging results that were available during my care of the patient were reviewed by me and considered in my medical decision making (see chart for details).     Vitals:   12/07/17 0026 12/07/17 0520  BP: 126/68 122/80  Pulse: 88 80  Resp: 18 19  Temp: 98 F (36.7  C)   TempSrc: Oral   SpO2: 100% 100%    Medications  Tdap (BOOSTRIX) injection 0.5 mL (0.5 mLs Intramuscular Given 12/07/17 0503)  lidocaine (PF) (XYLOCAINE) 1 % injection 5 mL (5 mLs Intradermal Given 12/07/17 0505)    Jeffrey [name] is 61 y.o. male presenting with avulsion to the dorsum of thumb.  Tetanus is updated, wound is cleaned and dressed with Dermabond.  Patient placed in splint and counseled on wound care and return precaution  Final Clinical Impressions(s) / ED Diagnoses   Final diagnoses:  Avulsion of skin of finger, initial encounter    ED Discharge Orders    None       Lutricia Widjaja, Charna Elizabeth 12/07/17 0542    Veryl Speak, MD  12/07/17 0644  

## 2017-12-07 NOTE — Discharge Instructions (Signed)
Please follow with your primary care doctor in the next 2 days for a check-up. They must obtain records for further management.  ° °Do not hesitate to return to the Emergency Department for any new, worsening or concerning symptoms.  ° °

## 2017-12-07 NOTE — ED Triage Notes (Signed)
2cm laceration to R thumb, bleeding controlled with pressure bandage. States cut self with serrated wood saw by accident. Needs TDAP updated. CMS and mobility intact.

## 2017-12-07 NOTE — ED Notes (Signed)
Patient Alert and oriented X4. Stable and ambulatory. Patient verbalized understanding of the discharge instructions.  Patient belongings were taken by the patient.  

## 2017-12-21 ENCOUNTER — Other Ambulatory Visit: Payer: Self-pay | Admitting: Adult Health

## 2018-06-24 DIAGNOSIS — R3912 Poor urinary stream: Secondary | ICD-10-CM | POA: Diagnosis not present

## 2018-07-24 DIAGNOSIS — H2513 Age-related nuclear cataract, bilateral: Secondary | ICD-10-CM | POA: Diagnosis not present

## 2018-07-24 DIAGNOSIS — H43812 Vitreous degeneration, left eye: Secondary | ICD-10-CM | POA: Diagnosis not present

## 2018-07-28 DIAGNOSIS — Z1159 Encounter for screening for other viral diseases: Secondary | ICD-10-CM | POA: Diagnosis not present

## 2018-07-28 DIAGNOSIS — E7800 Pure hypercholesterolemia, unspecified: Secondary | ICD-10-CM | POA: Diagnosis not present

## 2018-07-28 DIAGNOSIS — Z0000 Encounter for general adult medical examination without abnormal findings: Secondary | ICD-10-CM | POA: Diagnosis not present

## 2018-08-07 DIAGNOSIS — H2513 Age-related nuclear cataract, bilateral: Secondary | ICD-10-CM | POA: Diagnosis not present

## 2018-08-07 DIAGNOSIS — H43812 Vitreous degeneration, left eye: Secondary | ICD-10-CM | POA: Diagnosis not present

## 2018-08-08 ENCOUNTER — Encounter (INDEPENDENT_AMBULATORY_CARE_PROVIDER_SITE_OTHER): Payer: 59 | Admitting: Ophthalmology

## 2018-08-29 DIAGNOSIS — R3912 Poor urinary stream: Secondary | ICD-10-CM | POA: Diagnosis not present

## 2018-08-29 DIAGNOSIS — N401 Enlarged prostate with lower urinary tract symptoms: Secondary | ICD-10-CM | POA: Diagnosis not present

## 2018-10-09 DIAGNOSIS — H2513 Age-related nuclear cataract, bilateral: Secondary | ICD-10-CM | POA: Diagnosis not present

## 2018-10-09 DIAGNOSIS — H43812 Vitreous degeneration, left eye: Secondary | ICD-10-CM | POA: Diagnosis not present

## 2018-11-10 ENCOUNTER — Encounter: Payer: Self-pay | Admitting: Adult Health

## 2018-11-10 ENCOUNTER — Ambulatory Visit: Payer: 59 | Admitting: Adult Health

## 2018-11-10 VITALS — BP 117/67 | HR 87 | Ht 73.0 in | Wt 200.0 lb

## 2018-11-10 DIAGNOSIS — G40309 Generalized idiopathic epilepsy and epileptic syndromes, not intractable, without status epilepticus: Secondary | ICD-10-CM

## 2018-11-10 NOTE — Progress Notes (Signed)
I have read the note, and I agree with the clinical assessment and plan.  Lonnie Reth K Tayt Moyers   

## 2018-11-10 NOTE — Progress Notes (Signed)
PATIENT: Jeffrey [name] DOB: 26-Nov-1956  REASON FOR VISIT: follow up HISTORY FROM: patient  HISTORY OF PRESENT ILLNESS: Today 11/10/18:  Mr. Geimer is a 62 year old male with a history of seizures.  He returns today for follow-up.  His seizures have been well controlled with Dilantin.  He continues taking Dilantin 350 mg at bedtime.  He denies any seizure events.  Denies any significant changes with his gait or balance.  He reports on occasion he will feel off balance.  Denies any falls.  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  He returns today for evaluation.  HISTORY (copied from Dr. Tobey Grim note)  Mr. Welton is a 62 year old right-handed white male with a history of seizures that have been under excellent control, the last seizure was in the late 1990s.  The patient is on Dilantin taking 350 mg at night.  He is retired, he sleeps well.  He denies any problems with drowsiness or gait instability on the Dilantin.  He returns to the office today for an evaluation.  REVIEW OF SYSTEMS: Out of a complete 14 system review of symptoms, the patient complains only of the following symptoms, and all other reviewed systems are negative.  Seizures, difficulty urinating  ALLERGIES: No Known Allergies  HOME MEDICATIONS: Outpatient Medications Prior to Visit  Medication Sig Dispense Refill  . amphetamine-dextroamphetamine (ADDERALL) 20 MG tablet Take 10 mg by mouth 3 (three) times daily.     Marland Kitchen atorvastatin (LIPITOR) 20 MG tablet Take 20 mg at bedtime by mouth.  3  . cholecalciferol (VITAMIN D) 1000 units tablet Take 1,000 Units daily by mouth.    . citalopram (CELEXA) 40 MG tablet Take 1 tablet by mouth daily as needed.  1  . Multiple Vitamin (MULTIVITAMIN WITH MINERALS) TABS Take 1 tablet by mouth daily.    . phenytoin (DILANTIN) 100 MG ER capsule Take 3 capsules (300 mg total) daily by mouth. 270 capsule 3  . phenytoin (PHENYTOIN  INFATABS) 50 MG tablet CHEW AND SWALLOW 1 TABLET(50 MG) BY MOUTH DAILY 90 tablet 3  . silodosin (RAPAFLO) 8 MG CAPS capsule Take 8 mg by mouth at bedtime.  11  . Tamsulosin HCl (FLOMAX) 0.4 MG CAPS Take 0.4 mg by mouth 2 (two) times daily.      No facility-administered medications prior to visit.     PAST MEDICAL HISTORY: Past Medical History:  Diagnosis Date  . ADD (attention deficit disorder)   . Benign prostatic hypertrophy   . Depression   . Seizures ([place])     PAST SURGICAL HISTORY: Past Surgical History:  Procedure Laterality Date  . APPENDECTOMY      FAMILY HISTORY: Family History  Problem Relation Age of Onset  . Prostate cancer Father   . Seizures Father   . Heart Problems Mother   . Stroke Sister   . Seizures Son     SOCIAL HISTORY: Social History   Socioeconomic History  . Marital status: Divorced    Spouse name: Not on file  . Number of children: 2  . Years of education: Not on file  . Highest education level: Not on file  Occupational History    Employer: [employer]  . Financial resource strain: Not on file  . Food insecurity:    Worry: Not on file    Inability: Not on file  . Transportation needs:    Medical: Not on file  Non-medical: Not on file  Tobacco Use  . Smoking status: Former Smoker    Types: Cigarettes  . Smokeless tobacco: Never Used  . Tobacco comment: Quit 1977  Substance and Sexual Activity  . Alcohol use: Yes    Comment: rarely  . Drug use: No  . Sexual activity: Not on file  Lifestyle  . Physical activity:    Days per week: Not on file    Minutes per session: Not on file  . Stress: Not on file  Relationships  . Social connections:    Talks on phone: Not on file    Gets together: Not on file    Attends religious service: Not on file    Active member of club or organization: Not on file    Attends meetings of clubs or organizations: Not on file    Relationship status: Not on file  . Intimate  partner violence:    Fear of current or ex partner: Not on file    Emotionally abused: Not on file    Physically abused: Not on file    Forced sexual activity: Not on file  Other Topics Concern  . Not on file  Social History Narrative   Patient is divorced and works for [employer].    Education- college   Left handed.   Caffeine- None      PHYSICAL EXAM  Vitals:   11/10/18 1033  BP: 117/67  Pulse: 87  Weight: 200 lb (90.7 kg)  Height: 6\' 1"  (1.854 m)   Body mass index is 26.39 kg/m.  Generalized: Well developed, in no acute distress   Neurological examination  Mentation: Alert oriented to time, place, history taking. Follows all commands speech and language fluent Cranial nerve II-XII: Pupils were equal round reactive to light. Extraocular movements were full, visual field were full on confrontational test. Facial sensation and strength were normal. Uvula tongue midline. Head turning and shoulder shrug  were normal and symmetric. Motor: The motor testing reveals 5 over 5 strength of all 4 extremities. Good symmetric motor tone is noted throughout.  Sensory: Sensory testing is intact to soft touch on all 4 extremities. No evidence of extinction is noted.  Coordination: Cerebellar testing reveals good finger-nose-finger and heel-to-shin bilaterally.  Gait and station: Gait is normal. Tandem gait is normal. Romberg is negative. No drift is seen.  Reflexes: Deep tendon reflexes are symmetric and normal bilaterally.   DIAGNOSTIC DATA (LABS, IMAGING, TESTING) - I reviewed patient records, labs, notes, testing and imaging myself where available.  Lab Results  Component Value Date   WBC 5.2 11/08/2017   HGB 13.6 11/08/2017   HCT 40.6 11/08/2017   MCV 97 11/08/2017   PLT 185 11/08/2017      Component Value Date/Time   NA 141 11/08/2017 1032   K 4.7 11/08/2017 1032   CL 100 11/08/2017 1032   CO2 30 (H) 11/08/2017 1032   GLUCOSE 80 11/08/2017 1032   GLUCOSE 117  (H) 09/14/2009 1505   BUN 16 11/08/2017 1032   CREATININE 1.03 11/08/2017 1032   CALCIUM 9.9 11/08/2017 1032   PROT 7.3 11/08/2017 1032   ALBUMIN 4.8 11/08/2017 1032   AST 26 11/08/2017 1032   ALT 28 11/08/2017 1032   ALKPHOS 49 11/08/2017 1032   BILITOT 0.4 11/08/2017 1032   GFRNONAA 79 11/08/2017 1032   GFRAA 91 11/08/2017 1032      ASSESSMENT AND PLAN 62 y.o. year old male  has a past medical history of  ADD (attention deficit disorder), Benign prostatic hypertrophy, Depression, and Seizures ([place]). here with:  1.  Seizures  Overall the patient is doing well.  He will continue on Dilantin 350 mg at bedtime.  I will check blood work today.  He is advised that if his symptoms worsen or he develops new symptoms he should let us know.  He will follow-up in 1 year or sooner if needed.   I spent 15 minutes with the patient. 50% of this time was spent reviewing plan of care   Ward Givens, MSN, NP-C 11/10/2018, 10:57 AM [facility] [address], [place], [place] [address] [phone]

## 2018-11-10 NOTE — Patient Instructions (Signed)
Your Plan:  Continue Dilantin 350 mg at bedtime Blood work today If you have any seizure events please let us know.   Thank you for coming to see [place] at [facility]. I hope we have been able to provide you high quality care today.  You may receive a patient satisfaction survey over the next few weeks. We would appreciate your feedback and comments so that we may continue to improve ourselves and the health of our patients.

## 2018-11-11 ENCOUNTER — Encounter: Payer: Self-pay | Admitting: *Deleted

## 2018-11-11 LAB — CBC WITH DIFFERENTIAL/PLATELET
BASOS ABS: 0 10*3/uL (ref 0.0–0.2)
Basos: 0 %
EOS (ABSOLUTE): 0.2 10*3/uL (ref 0.0–0.4)
EOS: 4 %
Hematocrit: 41.5 % (ref 37.5–51.0)
Hemoglobin: 14 g/dL (ref 13.0–17.7)
IMMATURE GRANULOCYTES: 0 %
Immature Grans (Abs): 0 10*3/uL (ref 0.0–0.1)
Lymphocytes Absolute: 1.5 10*3/uL (ref 0.7–3.1)
Lymphs: 28 %
MCH: 32 pg (ref 26.6–33.0)
MCHC: 33.7 g/dL (ref 31.5–35.7)
MCV: 95 fL (ref 79–97)
MONOS ABS: 0.4 10*3/uL (ref 0.1–0.9)
Monocytes: 7 %
NEUTROS PCT: 61 %
Neutrophils Absolute: 3.2 10*3/uL (ref 1.4–7.0)
PLATELETS: 184 10*3/uL (ref 150–450)
RBC: 4.38 x10E6/uL (ref 4.14–5.80)
RDW: 12.3 % (ref 12.3–15.4)
WBC: 5.2 10*3/uL (ref 3.4–10.8)

## 2018-11-11 LAB — COMPREHENSIVE METABOLIC PANEL
ALT: 37 IU/L (ref 0–44)
AST: 29 IU/L (ref 0–40)
Albumin/Globulin Ratio: 2 (ref 1.2–2.2)
Albumin: 4.7 g/dL (ref 3.6–4.8)
Alkaline Phosphatase: 46 IU/L (ref 39–117)
BUN/Creatinine Ratio: 12 (ref 10–24)
BUN: 13 mg/dL (ref 8–27)
Bilirubin Total: 0.3 mg/dL (ref 0.0–1.2)
CO2: 27 mmol/L (ref 20–29)
CREATININE: 1.05 mg/dL (ref 0.76–1.27)
Calcium: 9.6 mg/dL (ref 8.6–10.2)
Chloride: 99 mmol/L (ref 96–106)
GFR calc non Af Amer: 76 mL/min/{1.73_m2} (ref >59)
GFR, EST AFRICAN AMERICAN: 88 mL/min/{1.73_m2} (ref >59)
GLOBULIN, TOTAL: 2.3 g/dL (ref 1.5–4.5)
Glucose: 79 mg/dL (ref 65–99)
Potassium: 4.7 mmol/L (ref 3.5–5.2)
Sodium: 139 mmol/L (ref 134–144)
TOTAL PROTEIN: 7 g/dL (ref 6.0–8.5)

## 2018-11-11 LAB — PHENYTOIN LEVEL, TOTAL: PHENYTOIN (DILANTIN), SERUM: 17.2 ug/mL (ref 10.0–20.0)

## 2018-11-17 ENCOUNTER — Other Ambulatory Visit: Payer: Self-pay

## 2018-11-17 ENCOUNTER — Encounter (HOSPITAL_COMMUNITY): Payer: Self-pay | Admitting: *Deleted

## 2018-11-17 ENCOUNTER — Inpatient Hospital Stay (HOSPITAL_COMMUNITY)
Admission: AD | Admit: 2018-11-17 | Discharge: 2018-11-20 | DRG: 700 | Disposition: A | Discharge status: Home / Self Care | Payer: 59 | Source: Ambulatory Visit | Attending: Urology | Admitting: Urology

## 2018-11-17 DIAGNOSIS — N401 Enlarged prostate with lower urinary tract symptoms: Secondary | ICD-10-CM | POA: Diagnosis not present

## 2018-11-17 DIAGNOSIS — N3289 Other specified disorders of bladder: Secondary | ICD-10-CM | POA: Diagnosis present

## 2018-11-17 DIAGNOSIS — R3912 Poor urinary stream: Secondary | ICD-10-CM | POA: Diagnosis not present

## 2018-11-17 DIAGNOSIS — T8383XA Hemorrhage of genitourinary prosthetic devices, implants and grafts, initial encounter: Principal | ICD-10-CM | POA: Diagnosis present

## 2018-11-17 DIAGNOSIS — R310 Gross hematuria: Secondary | ICD-10-CM | POA: Diagnosis present

## 2018-11-17 DIAGNOSIS — Y846 Urinary catheterization as the cause of abnormal reaction of the patient, or of later complication, without mention of misadventure at the time of the procedure: Secondary | ICD-10-CM | POA: Diagnosis present

## 2018-11-17 DIAGNOSIS — Z87891 Personal history of nicotine dependence: Secondary | ICD-10-CM

## 2018-11-17 DIAGNOSIS — R351 Nocturia: Secondary | ICD-10-CM | POA: Diagnosis not present

## 2018-11-17 DIAGNOSIS — R319 Hematuria, unspecified: Secondary | ICD-10-CM | POA: Diagnosis present

## 2018-11-17 LAB — HEMOGLOBIN AND HEMATOCRIT, BLOOD
HCT: 38.8 % — ABNORMAL LOW (ref 39.0–52.0)
Hemoglobin: 12.6 g/dL — ABNORMAL LOW (ref 13.0–17.0)

## 2018-11-17 MED ORDER — SODIUM CHLORIDE 0.45 % IV SOLN
INTRAVENOUS | Status: DC
  Administered 2018-11-17 – 2018-11-20 (×6): via INTRAVENOUS

## 2018-11-17 MED ORDER — ZOLPIDEM TARTRATE 5 MG PO TABS
5.0000 mg | ORAL_TABLET | Freq: Every evening | ORAL | Status: DC | PRN
  Administered 2018-11-20: 5 mg via ORAL
  Filled 2018-11-17: qty 1

## 2018-11-17 MED ORDER — PHENYTOIN SODIUM EXTENDED 100 MG PO CAPS
200.0000 mg | ORAL_CAPSULE | Freq: Once | ORAL | Status: AC
  Administered 2018-11-17: 200 mg via ORAL
  Filled 2018-11-17: qty 2

## 2018-11-17 MED ORDER — OXYCODONE HCL 5 MG PO TABS
5.0000 mg | ORAL_TABLET | ORAL | Status: DC | PRN
  Administered 2018-11-17 – 2018-11-20 (×7): 5 mg via ORAL
  Filled 2018-11-17 (×7): qty 1

## 2018-11-17 MED ORDER — BELLADONNA ALKALOIDS-OPIUM 16.2-60 MG RE SUPP
1.0000 | Freq: Four times a day (QID) | RECTAL | Status: DC | PRN
  Administered 2018-11-17 – 2018-11-20 (×5): 1 via RECTAL
  Filled 2018-11-17 (×5): qty 1

## 2018-11-17 MED ORDER — SENNA 8.6 MG PO TABS
1.0000 | ORAL_TABLET | Freq: Two times a day (BID) | ORAL | Status: DC
  Administered 2018-11-17 – 2018-11-20 (×6): 8.6 mg via ORAL
  Filled 2018-11-17 (×6): qty 1

## 2018-11-17 MED ORDER — BACITRACIN-NEOMYCIN-POLYMYXIN 400-5-5000 EX OINT: 1.0000 "application " | TOPICAL_OINTMENT | Freq: Three times a day (TID) | CUTANEOUS | Status: DC | PRN

## 2018-11-17 MED ORDER — CITALOPRAM HYDROBROMIDE 20 MG PO TABS
40.0000 mg | ORAL_TABLET | Freq: Once | ORAL | Status: AC
  Administered 2018-11-17: 40 mg via ORAL
  Filled 2018-11-17: qty 2

## 2018-11-17 MED ORDER — CEPHALEXIN 500 MG PO CAPS
500.0000 mg | ORAL_CAPSULE | Freq: Two times a day (BID) | ORAL | Status: DC
  Administered 2018-11-17 – 2018-11-20 (×6): 500 mg via ORAL
  Filled 2018-11-17 (×6): qty 1

## 2018-11-17 MED ORDER — MORPHINE SULFATE (PF) 2 MG/ML IV SOLN
2.0000 mg | INTRAVENOUS | Status: DC | PRN
  Administered 2018-11-18: 2 mg via INTRAVENOUS
  Administered 2018-11-18: 4 mg via INTRAVENOUS
  Administered 2018-11-18 (×2): 2 mg via INTRAVENOUS
  Administered 2018-11-19 – 2018-11-20 (×2): 4 mg via INTRAVENOUS
  Filled 2018-11-17: qty 2
  Filled 2018-11-17 (×3): qty 1
  Filled 2018-11-17 (×2): qty 2

## 2018-11-17 NOTE — H&P (Signed)
H&P  Chief Complaint:  Bladder pain, gross hematuria  History of Present Illness:  62 year old male status post Urolift procedure earlier today presents to the office with bladder pain and gross hematuria.  He was having bladder spasms.  Catheter was irrigated, only about 30-40 cc of clots were irrigated.  However, there is persistent bloody urine.  He is admitted at this time for pain management and catheter management.  Past Medical History:  Diagnosis Date  . ADD (attention deficit disorder)   . Benign prostatic hypertrophy   . Depression   . Seizures ([place])     Past Surgical History:  Procedure Laterality Date  . APPENDECTOMY      Home Medications:    Allergies: No Known Allergies  Family History  Problem Relation Age of Onset  . Prostate cancer Father   . Seizures Father   . Heart Problems Mother   . Stroke Sister   . Seizures Son     Social History:  reports that he has quit smoking. His smoking use included cigarettes. He has never used smokeless tobacco. He reports that he drinks alcohol. He reports that he does not use drugs.  ROS: A complete review of systems was performed.  All systems are negative except for pertinent findings as noted.  Physical Exam:  Vital signs in last 24 hours:   Constitutional:  Alert and oriented, No acute distress Cardiovascular: Regular rate  Respiratory: Normal respiratory effort GI: Abdomen is soft, nontender, nondistended, no abdominal masses. No CVAT.  Genitourinary: Normal male phallus, testes are descended bilaterally and non-tender and without masses, scrotum is normal in appearance without lesions or masses, perineum is normal on inspection.  Catheter is present at meatus.  There is some blood around the catheter. Lymphatic: No lymphadenopathy Neurologic: Grossly intact, no focal deficits Psychiatric: Normal mood and affect  Laboratory Data:  No results for input(s): WBC, HGB, HCT, PLT in the last 72 hours.  No results  for input(s): NA, K, CL, GLUCOSE, BUN, CALCIUM, CREATININE in the last 72 hours.  Invalid input(s): CO3   No results found for this or any previous visit (from the past 24 hour(s)). No results found for this or any previous visit (from the past 240 hour(s)).  Renal Function: No results for input(s): CREATININE in the last 168 hours. Estimated Creatinine Clearance: 83.5 mL/min (by C-G formula based on SCr of 1.05 mg/dL).  Radiologic Imaging: No results found.  Impression/Assessment:    BPH, status post Urolift procedure.  He is having abnormal amount of bleeding.  Plan:    Admit for catheter drainage, p.r.n. Irrigation, pain management  Hopefully, bleeding will slow and discharged by tomorrow will be possible

## 2018-11-18 DIAGNOSIS — N401 Enlarged prostate with lower urinary tract symptoms: Secondary | ICD-10-CM | POA: Diagnosis not present

## 2018-11-18 DIAGNOSIS — N3289 Other specified disorders of bladder: Secondary | ICD-10-CM | POA: Diagnosis not present

## 2018-11-18 DIAGNOSIS — R310 Gross hematuria: Secondary | ICD-10-CM | POA: Diagnosis not present

## 2018-11-18 LAB — HIV ANTIBODY (ROUTINE TESTING W REFLEX): HIV Screen 4th Generation wRfx: NONREACTIVE

## 2018-11-18 MED ORDER — AMPHETAMINE-DEXTROAMPHETAMINE 10 MG PO TABS
10.0000 mg | ORAL_TABLET | Freq: Three times a day (TID) | ORAL | Status: DC
  Administered 2018-11-19 (×2): 10 mg via ORAL
  Filled 2018-11-18 (×5): qty 1

## 2018-11-18 MED ORDER — LIDOCAINE HCL URETHRAL/MUCOSAL 2 % EX GEL
1.0000 "application " | Freq: Once | CUTANEOUS | Status: AC
  Administered 2018-11-18: 1 via TOPICAL
  Filled 2018-11-18 (×2): qty 5

## 2018-11-18 MED ORDER — CITALOPRAM HYDROBROMIDE 20 MG PO TABS
40.0000 mg | ORAL_TABLET | Freq: Every day | ORAL | Status: DC
  Administered 2018-11-18 – 2018-11-19 (×2): 40 mg via ORAL
  Filled 2018-11-18 (×2): qty 2

## 2018-11-18 MED ORDER — PHENYTOIN SODIUM EXTENDED 100 MG PO CAPS
200.0000 mg | ORAL_CAPSULE | Freq: Every day | ORAL | Status: DC
  Administered 2018-11-18 – 2018-11-19 (×2): 200 mg via ORAL
  Filled 2018-11-18 (×2): qty 2

## 2018-11-18 MED ORDER — PHENYTOIN SODIUM EXTENDED 100 MG PO CAPS
100.0000 mg | ORAL_CAPSULE | Freq: Every day | ORAL | Status: DC
  Administered 2018-11-18 – 2018-11-20 (×3): 100 mg via ORAL
  Filled 2018-11-18 (×3): qty 1

## 2018-11-18 MED ORDER — SODIUM CHLORIDE 0.9 % IR SOLN
3000.0000 mL | Status: DC
  Administered 2018-11-18 – 2018-11-20 (×22): 3000 mL via INTRAVESICAL

## 2018-11-18 MED ORDER — TAMSULOSIN HCL 0.4 MG PO CAPS
0.8000 mg | ORAL_CAPSULE | Freq: Every day | ORAL | Status: DC
  Administered 2018-11-18 – 2018-11-19 (×2): 0.8 mg via ORAL
  Filled 2018-11-18 (×2): qty 2

## 2018-11-18 MED ORDER — PHENYTOIN 50 MG PO CHEW
50.0000 mg | CHEWABLE_TABLET | Freq: Every day | ORAL | Status: DC
  Administered 2018-11-18 – 2018-11-20 (×3): 50 mg via ORAL
  Filled 2018-11-18 (×3): qty 1

## 2018-11-18 MED ORDER — PHENYTOIN SODIUM EXTENDED 100 MG PO CAPS: 200.0000 mg | ORAL_CAPSULE | Freq: Two times a day (BID) | ORAL | Status: DC

## 2018-11-18 NOTE — Progress Notes (Addendum)
Patient has had foley catheter hand irrigated 5x this shift. Multiple clots returned each time. Urine is still dark red and bloody but gets slightly lighter with irrigation. Patient has had relief with irrigation. See flowsheets for output totals.

## 2018-11-18 NOTE — Progress Notes (Signed)
  Subjective: Patient reports continued pain. Bladder irrigated through the night.  Objective: Vital signs in last 24 hours: Temp:  [97.9 F (36.6 C)-98.4 F (36.9 C)] 98 F (36.7 C) (11/26 1610) Pulse Rate:  [75-91] 91 (11/26 1610) Resp:  [14-16] 16 (11/26 1610) BP: (114-142)/(69-77) 142/77 (11/26 1610) SpO2:  [94 %-96 %] 94 % (11/26 1610)  Intake/Output from previous day: 11/25 0701 - 11/26 0700 In: 1521.5 [P.O.:420; I.V.:681.5] Out: 2090 [Urine:2090] Intake/Output this shift: Total I/O In: -  Out: [place] [Urine:3850]  Physical Exam:  Constitutional: Vital signs reviewed. WD WN in NAD   Eyes: PERRL, No scleral icterus.   Cardiovascular: RRR Pulmonary/Chest: Normal effort Extremities: No cyanosis or edema   Lab Results: Recent Labs    11/17/18 1747  HGB 12.6*  HCT 38.8*   BMET No results for input(s): NA, K, CL, CO2, GLUCOSE, BUN, CREATININE, CALCIUM in the last 72 hours. No results for input(s): LABPT, INR in the last 72 hours. No results for input(s): LABURIN in the last 72 hours. Results for orders placed or performed during the hospital encounter of 12/23/16  Urine culture     Status: None   Collection Time: 12/23/16 11:25 AM  Result Value Ref Range Status   Specimen Description URINE, RANDOM  Final   Special Requests NONE  Final   Culture NO GROWTH Performed at [facility]   Final   Report Status 12/24/2016 FINAL  Final    Studies/Results: No results found.  Assessment/Plan:  Hematuria post Urolift--continuing I'll have catheter changed for CBI catheter Check H/H in AM   LOS: 1 day   Jorja Loa 11/18/2018, 6:35 PM

## 2018-11-19 DIAGNOSIS — N3289 Other specified disorders of bladder: Secondary | ICD-10-CM | POA: Diagnosis not present

## 2018-11-19 DIAGNOSIS — T8383XA Hemorrhage of genitourinary prosthetic devices, implants and grafts, initial encounter: Secondary | ICD-10-CM | POA: Diagnosis not present

## 2018-11-19 DIAGNOSIS — N401 Enlarged prostate with lower urinary tract symptoms: Secondary | ICD-10-CM | POA: Diagnosis not present

## 2018-11-19 DIAGNOSIS — Y846 Urinary catheterization as the cause of abnormal reaction of the patient, or of later complication, without mention of misadventure at the time of the procedure: Secondary | ICD-10-CM | POA: Diagnosis present

## 2018-11-19 DIAGNOSIS — R319 Hematuria, unspecified: Secondary | ICD-10-CM | POA: Diagnosis present

## 2018-11-19 DIAGNOSIS — Z87891 Personal history of nicotine dependence: Secondary | ICD-10-CM | POA: Diagnosis not present

## 2018-11-19 DIAGNOSIS — R310 Gross hematuria: Secondary | ICD-10-CM | POA: Diagnosis not present

## 2018-11-19 LAB — HEMOGLOBIN AND HEMATOCRIT, BLOOD
HCT: 32.2 % — ABNORMAL LOW (ref 39.0–52.0)
HEMOGLOBIN: 10.5 g/dL — AB (ref 13.0–17.0)

## 2018-11-19 NOTE — Progress Notes (Signed)
Patient in pain with leaking around foley insertion site. Manual irrigation performed with multiple large blood clots. CBI rate increased. Color of output fluctuates from clear to pink to red intermittently. Patient no longer experiencing pain after irrigation and increased rate of CBI. Will continue to monitor.

## 2018-11-19 NOTE — Progress Notes (Signed)
Patient c/o burning and catheter leaking around insertion site. Still draining and CBI running smoothly. B& O Suppository given to patient.

## 2018-11-19 NOTE — Progress Notes (Signed)
Patient's FC required hand irrigation 4 times this shift d/t large blood clots. Many blood clots were returned each time. Pt also experienced bladder spasms and clots present each time he got OOB. B&O suppository ineffective but PRN Oxy IR did provide relief. CBI continues to run at a fast/moderate rate. Wife at bedside, both concerned about continued bleeding and clots.

## 2018-11-19 NOTE — Progress Notes (Signed)
  Subjective: Patient reports that he is currently comfortable, but still experiencing clots/spasms  Objective: Vital signs in last 24 hours: Temp:  [98 F (36.7 C)-98.6 F (37 C)] 98.6 F (37 C) (11/27 0452) Pulse Rate:  [82-91] 87 (11/27 0452) Resp:  [14-16] 16 (11/27 0452) BP: (112-142)/(71-77) 121/73 (11/27 0452) SpO2:  [94 %-95 %] 95 % (11/27 0452)  Intake/Output from previous day: 11/26 0701 - 11/27 0700 In: 23484.7 [P.O.:780; I.V.:1434.7] Out: 28675 [Urine:28675] Intake/Output this shift: Total I/O In: 3000 [Other:3000] Out: 5150 [Urine:5150]  Physical Exam:  Constitutional: Vital signs reviewed. WD WN in NAD   Eyes: PERRL, No scleral icterus.   Cardiovascular: RRR Extremities: No cyanosis or edema   Irrigant light pink--I slowed CBI Lab Results: Recent Labs    11/17/18 1747 11/19/18 0424  HGB 12.6* 10.5*  HCT 38.8* 32.2*   BMET No results for input(s): NA, K, CL, CO2, GLUCOSE, BUN, CREATININE, CALCIUM in the last 72 hours. No results for input(s): LABPT, INR in the last 72 hours. No results for input(s): LABURIN in the last 72 hours. Results for orders placed or performed during the hospital encounter of 12/23/16  Urine culture     Status: None   Collection Time: 12/23/16 11:25 AM  Result Value Ref Range Status   Specimen Description URINE, RANDOM  Final   Special Requests NONE  Final   Culture NO GROWTH Performed at [facility]   Final   Report Status 12/24/2016 FINAL  Final    Studies/Results: No results found.  Assessment/Plan:   HD 2 hematuria post Urolift. HCT lower--most likely not bleeding significantly now  I slowed CBI--increase tate as needed. Will attempt to stop and possibly d/c in am if appropriate   LOS: 1 day   Jorja Loa 11/19/2018, 8:35 AM

## 2018-11-19 NOTE — Progress Notes (Signed)
Pt's wife called this RN to room stating pt "doesn't feel right" and "felt like he might have had a seizure" while attempting to get OOB to ambulate. Upon assessment, pt was lying quietly in bed with his eyes closed, in no distress. Pt states he felt "lightheaded and dizzy" and had ringing in both ears upon standing. Pt states he has a history of seizures but they are usually tonic clonic seizures, and has not had one since 1997. Pt states he has never experienced these symptoms before and had no other neurological symptoms. VSS. Pt states he feels better after lying down. Will assess orthostatic VS when pt feels he can stand again.  Pt's FC has been hand irrigated x1 this shift, with MANY blood clots returned. Pt felt pressure and pain in bladder before hand irrigation but had immediate relief after. CBI continues to run at a moderate/slow rate per MD.

## 2018-11-20 LAB — HEMOGLOBIN AND HEMATOCRIT, BLOOD
HCT: 30.3 % — ABNORMAL LOW (ref 39.0–52.0)
HEMOGLOBIN: 9.9 g/dL — AB (ref 13.0–17.0)

## 2018-11-20 NOTE — Progress Notes (Signed)
Patient voided 254mL of bloody urine with no clots. PVR = 5. Pt states he feels he is emptying bladder completely  Barbee Shropshire. Brigitte Pulse, RN

## 2018-11-20 NOTE — Progress Notes (Signed)
The patient's foley catheter required hand irrigation which was performed 2 x in an hour. There was 1 medium clot and several small clots returned from irrigation.  Then a B&O suppository was administered.

## 2018-11-20 NOTE — Progress Notes (Signed)
  Subjective: Patient reports multiple bladder spasms. Catheter irrigated several times last pm--not much sleep.  Objective: Vital signs in last 24 hours: Temp:  [98 F (36.7 C)-99.3 F (37.4 C)] 98.2 F (36.8 C) (11/28 0440) Pulse Rate:  [79-99] 81 (11/28 0440) Resp:  [17-18] 18 (11/28 0440) BP: (122-131)/(68-74) 122/72 (11/28 0440) SpO2:  [92 %-94 %] 92 % (11/28 0440)  Intake/Output from previous day: 11/27 0701 - 11/28 0700 In: 32446.2 [P.O.:1080; I.V.:2066.2] Out: 43326 [Urine:43325; Stool:1] Intake/Output this shift: No intake/output data recorded.  Physical Exam:  Constitutional: Vital signs reviewed. WD WN in NAD   Eyes: PERRL, No scleral icterus.   Cardiovascular: RRR Pulmonary/Chest: Normal effort Extremities: No cyanosis or edema   Lab Results: Recent Labs    11/17/18 1747 11/19/18 0424 11/20/18 0428  HGB 12.6* 10.5* 9.9*  HCT 38.8* 32.2* 30.3*   BMET No results for input(s): NA, K, CL, CO2, GLUCOSE, BUN, CREATININE, CALCIUM in the last 72 hours. No results for input(s): LABPT, INR in the last 72 hours. No results for input(s): LABURIN in the last 72 hours. Results for orders placed or performed during the hospital encounter of 12/23/16  Urine culture     Status: None   Collection Time: 12/23/16 11:25 AM  Result Value Ref Range Status   Specimen Description URINE, RANDOM  Final   Special Requests NONE  Final   Culture NO GROWTH Performed at [facility]   Final   Report Status 12/24/2016 FINAL  Final    Studies/Results: No results found.  Assessment/Plan:  HD 3 s/p urolift with gross hematuria. I removed catheter this am-may be causing the sppasms/bleeding.  Clear liquids only--if he develops recurrent hematuria with retention wiil perform cysto later.   LOS: 2 days   Jeffrey [name] 11/20/2018, 7:53 AM

## 2018-11-20 NOTE — Progress Notes (Signed)
Clot load has decreased substantially since beginning of shift.  CBI continues to run fast.  Continues to require hand irrigation at least 2 times an hour.  From 1900 to 0400 have used 25,500 of irrigant from CBI and hand irrigation.  Pt has had 27,500 cc return.  True urine avg 222/hr

## 2018-11-20 NOTE — Progress Notes (Signed)
Reviewed discharge information with patient and wife. Answered all questions. Patient and wife able to teach back medications and reasons to contact MD/911. Patient verbalizes importance of PCP/urology  follow up appointment.  Barbee Shropshire. Brigitte Pulse, RN

## 2018-11-20 NOTE — Progress Notes (Signed)
Pt has been ale to tolerate decrease in CBI rate the last 2 hours and has not needed any hand irrigation at all the past 2 hours.  Urine remains clear and light pink.  Denies any pain for past 2 hours.

## 2018-11-28 ENCOUNTER — Other Ambulatory Visit: Payer: Self-pay | Admitting: Neurology

## 2018-11-28 MED ORDER — PHENYTOIN SODIUM EXTENDED 100 MG PO CAPS: 300.0000 mg | ORAL_CAPSULE | Freq: Every day | ORAL | 3 refills | Status: DC

## 2018-12-01 NOTE — Discharge Summary (Signed)
Patient ID: Jeffrey [name] MRN: [id number] DOB/AGE: [birth year] 62 y.o.  Admit date: 11/17/2018 Discharge date:11/20/2018  Primary Care Physician:  Jeffrey Cha, MD  Discharge Diagnoses:  N40.1 Gross hematuria Present on Admission: . Gross hematuria . Hematuria     Discharge Medications: Allergies as of 11/20/2018   No Known Allergies     Medication List    STOP taking these medications   cephALEXin 500 MG capsule Commonly known as:  KEFLEX     TAKE these medications   amphetamine-dextroamphetamine 20 MG tablet Commonly known as:  ADDERALL Take 10 mg by mouth 3 (three) times daily.   atorvastatin 20 MG tablet Commonly known as:  LIPITOR Take 20 mg at bedtime by mouth.   cholecalciferol 1000 units tablet Commonly known as:  VITAMIN D Take 1,000 Units daily by mouth.   citalopram 40 MG tablet Commonly known as:  CELEXA Take 1 tablet by mouth at bedtime.   multivitamin with minerals Tabs tablet Take 1 tablet by mouth daily.   silodosin 8 MG Caps capsule Commonly known as:  RAPAFLO Take 8 mg by mouth at bedtime.        Significant Diagnostic Studies:  No results found.  Brief H and P: For complete details please refer to admission H and P, but in brief Jeffrey [name] had significant gross hematuria following office Urolift on the day before admission. He was admitted for catheter drainage/irrigation.  Hospital Course: He had significant bladder spasms from the catheter--urine eventually cleared and he passed a voiding trial following catheter removal. Active Problems:   Gross hematuria   Hematuria   Day of Discharge BP 113/70 (BP Location: Left Arm)   Pulse 79   Temp 98.1 F (36.7 C) (Oral)   Resp 18   Ht 6\' 1"  (1.854 m)   Wt 87.8 kg   SpO2 94%   BMI 25.54 kg/m   No results found for this or any previous visit (from the past 24 hour(s)).  Physical Exam: General: Alert and awake oriented x3 not in any acute distress. HEENT: anicteric  sclera, pupils reactive to light and accommodation CVS: S1-S2 clear no murmur rubs or gallops Chest: clear to auscultation bilaterally, no wheezing rales or rhonchi Abdomen: soft nontender, nondistended, normal bowel sounds, no organomegaly Extremities: no cyanosis, clubbing or edema noted bilaterally Neuro: Cranial nerves II-XII intact, no focal neurological deficits  Disposition:  Home  Diet:  Regular  Activity:  Gradually increase   Disposition and Follow-up:     Will be arranged    DISCHARGE FOLLOW-UP    Time spent on Discharge:   15 mins  Signed: Jorja Loa 12/01/2018, 9:31 AM

## 2019-01-23 DIAGNOSIS — R3912 Poor urinary stream: Secondary | ICD-10-CM | POA: Diagnosis not present

## 2019-01-23 DIAGNOSIS — N401 Enlarged prostate with lower urinary tract symptoms: Secondary | ICD-10-CM | POA: Diagnosis not present

## 2019-09-22 ENCOUNTER — Other Ambulatory Visit: Payer: Self-pay | Admitting: Adult Health

## 2019-11-16 ENCOUNTER — Encounter: Payer: Self-pay | Admitting: Adult Health

## 2019-11-16 ENCOUNTER — Other Ambulatory Visit: Payer: Self-pay

## 2019-11-16 ENCOUNTER — Ambulatory Visit: Payer: 59 | Admitting: Adult Health

## 2019-11-16 VITALS — BP 124/80 | HR 83 | Temp 97.3°F | Ht 73.5 in | Wt 200.6 lb

## 2019-11-16 DIAGNOSIS — G40309 Generalized idiopathic epilepsy and epileptic syndromes, not intractable, without status epilepticus: Secondary | ICD-10-CM

## 2019-11-16 DIAGNOSIS — R251 Tremor, unspecified: Secondary | ICD-10-CM

## 2019-11-16 NOTE — Progress Notes (Signed)
PATIENT: Jeffrey [name] DOB: 1956/12/20  REASON FOR VISIT: follow up HISTORY FROM: patient  HISTORY OF PRESENT ILLNESS: Today 11/16/19: Jeffrey [name] is a 63 year old male with a history of seizures.  He returns today for follow-up.  He remains on Dilantin.  He reports that approximately 2 to 3 months ago he was having an intense argument with his wife.  He states in the middle of the argument he blacked out.  He said that he was only out for approximately 10 seconds.  He did fall to the ground.  He did not have any convulsing in the arms or legs.  He did not lose bowel or bladder.  He states in the past his seizure events has included convulsing.  He is not had any additional events since that time.  He continues operating a motor vehicle.  He is able to complete all ADLs independently.  He does note that he has a tremor particularly when he is grabbing a cup, eating and with his handwriting.  He denies any family history of tremor.  He returns today for an evaluation  HISTORY 11/10/18:  Jeffrey [name] is a 63 year old male with a history of seizures.  He returns today for follow-up.  His seizures have been well controlled with Dilantin.  He continues taking Dilantin 350 mg at bedtime.  He denies any seizure events.  Denies any significant changes with his gait or balance.  He reports on occasion he will feel off balance.  Denies any falls.  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  He returns today for evaluation  REVIEW OF SYSTEMS: Out of a complete 14 system review of symptoms, the patient complains only of the following symptoms, and all other reviewed systems are negative.  See HPI  ALLERGIES: No Known Allergies  HOME MEDICATIONS: Outpatient Medications Prior to Visit  Medication Sig Dispense Refill  . amphetamine-dextroamphetamine (ADDERALL) 20 MG tablet Take 10 mg by mouth 3 (three) times daily.     Marland Kitchen atorvastatin (LIPITOR) 20 MG  tablet Take 20 mg at bedtime by mouth.  3  . cholecalciferol (VITAMIN D) 1000 units tablet Take 1,000 Units daily by mouth.    . citalopram (CELEXA) 40 MG tablet Take 1 tablet by mouth at bedtime.   1  . Multiple Vitamin (MULTIVITAMIN WITH MINERALS) TABS Take 1 tablet by mouth daily.    . phenytoin (DILANTIN) 100 MG ER capsule TAKE 3 CAPSULES(300 MG) BY MOUTH AT BEDTIME 270 capsule 3  . PHENYTOIN INFATABS 50 MG tablet CHEW AND SWALLOW 1 TABLET(50 MG) BY MOUTH DAILY 90 tablet 3  . silodosin (RAPAFLO) 8 MG CAPS capsule Take 8 mg by mouth at bedtime.  11   No facility-administered medications prior to visit.     PAST MEDICAL HISTORY: Past Medical History:  Diagnosis Date  . ADD (attention deficit disorder)   . Benign prostatic hypertrophy   . Depression   . Seizures ([place])     PAST SURGICAL HISTORY: Past Surgical History:  Procedure Laterality Date  . APPENDECTOMY      FAMILY HISTORY: Family History  Problem Relation Age of Onset  . Prostate cancer Father   . Seizures Father   . Heart Problems Mother   . Stroke Sister   . Seizures Son     SOCIAL HISTORY: Social History   Socioeconomic History  . Marital status: Divorced    Spouse name: Not on file  . Number  of children: 2  . Years of education: Not on file  . Highest education level: Not on file  Occupational History    Employer: [employer]  . Financial resource strain: Not hard at all  . Food insecurity    Worry: Patient refused    Inability: Patient refused  . Transportation needs    Medical: Patient refused    Non-medical: Patient refused  Tobacco Use  . Smoking status: Former Smoker    Types: Cigarettes  . Smokeless tobacco: Never Used  . Tobacco comment: Quit 1977  Substance and Sexual Activity  . Alcohol use: Yes    Comment: rarely  . Drug use: No  . Sexual activity: Not on file  Lifestyle  . Physical activity    Days per week: Patient refused    Minutes per session: Patient  refused  . Stress: Patient refused  Relationships  . Social Herbalist on phone: Patient refused    Gets together: Patient refused    Attends religious service: Patient refused    Active member of club or organization: Patient refused    Attends meetings of clubs or organizations: Patient refused    Relationship status: Patient refused  . Intimate partner violence    Fear of current or ex partner: Patient refused    Emotionally abused: Patient refused    Physically abused: Patient refused    Forced sexual activity: Patient refused  Other Topics Concern  . Not on file  Social History Narrative   Patient is divorced and works for [employer].    Education- college   Left handed.   Caffeine- None      PHYSICAL EXAM  Vitals:   11/16/19 1027  BP: 124/80  Pulse: 83  Temp: (!) 97.3 F (36.3 C)  Weight: 200 lb 9.6 oz (91 kg)  Height: 6' 1.5" (1.867 m)   Body mass index is 26.11 kg/m.  Generalized: Well developed, in no acute distress   Neurological examination  Mentation: Alert oriented to time, place, history taking. Follows all commands speech and language fluent Cranial nerve II-XII: Pupils were equal round reactive to light. Extraocular movements were full, visual field were full on confrontational test.  Head turning and shoulder shrug  were normal and symmetric. Motor: The motor testing reveals 5 over 5 strength of all 4 extremities. Good symmetric motor tone is noted throughout.  Sensory: Sensory testing is intact to soft touch on all 4 extremities. No evidence of extinction is noted.  Coordination: Cerebellar testing reveals good finger-nose-finger and heel-to-shin bilaterally.  Mild tremor noticed with finger-nose-finger Gait and station: Gait is normal.  Reflexes: Deep tendon reflexes are symmetric and normal bilaterally.   DIAGNOSTIC DATA (LABS, IMAGING, TESTING) - I reviewed patient records, labs, notes, testing and imaging myself where  available.  Lab Results  Component Value Date   WBC 5.2 11/10/2018   HGB 9.9 (L) 11/20/2018   HCT 30.3 (L) 11/20/2018   MCV 95 11/10/2018   PLT 184 11/10/2018      Component Value Date/Time   NA 139 11/10/2018 1109   K 4.7 11/10/2018 1109   CL 99 11/10/2018 1109   CO2 27 11/10/2018 1109   GLUCOSE 79 11/10/2018 1109   GLUCOSE 117 (H) 09/14/2009 1505   BUN 13 11/10/2018 1109   CREATININE 1.05 11/10/2018 1109   CALCIUM 9.6 11/10/2018 1109   PROT 7.0 11/10/2018 1109   ALBUMIN 4.7 11/10/2018 1109   AST 29  11/10/2018 1109   ALT 37 11/10/2018 1109   ALKPHOS 46 11/10/2018 1109   BILITOT 0.3 11/10/2018 1109   GFRNONAA 76 11/10/2018 1109   GFRAA 88 11/10/2018 1109      ASSESSMENT AND PLAN 63 y.o. year old male  has a past medical history of ADD (attention deficit disorder), Benign prostatic hypertrophy, Depression, and Seizures ([place]). here with:  1.  Seizures  2.  Essential tremor  The patient will continue on Dilantin for a total of 350 mg daily.  I will check a Dilantin level today as well as a CBC and CMP.  Not sure that his event represents a seizure or possible syncopal episode?  We will continue to monitor for now.  His tremor is consistent with an essential tremor.  Advised that if his symptoms worsen or he develops new symptoms he should let us know.  He will follow-up in 1 year or sooner or sooner if needed     Ward Givens, MSN, NP-C 11/16/2019, 10:30 AM [facility] [address], [place], [place] [address] [phone]

## 2019-11-16 NOTE — Patient Instructions (Signed)
Continue Dilantin  Blood work today If you have any seizure events please let us know.    

## 2019-11-16 NOTE — Progress Notes (Signed)
I have read the note, and I agree with the clinical assessment and plan.  Charles K Willis   

## 2019-11-17 ENCOUNTER — Telehealth: Payer: Self-pay

## 2019-11-17 LAB — CBC WITH DIFFERENTIAL/PLATELET
Basophils Absolute: 0 10*3/uL (ref 0.0–0.2)
Basos: 1 %
EOS (ABSOLUTE): 0.2 10*3/uL (ref 0.0–0.4)
Eos: 4 %
Hematocrit: 43.6 % (ref 37.5–51.0)
Hemoglobin: 14.6 g/dL (ref 13.0–17.7)
Immature Grans (Abs): 0 10*3/uL (ref 0.0–0.1)
Immature Granulocytes: 0 %
Lymphocytes Absolute: 1.7 10*3/uL (ref 0.7–3.1)
Lymphs: 31 %
MCH: 32.2 pg (ref 26.6–33.0)
MCHC: 33.5 g/dL (ref 31.5–35.7)
MCV: 96 fL (ref 79–97)
Monocytes Absolute: 0.4 10*3/uL (ref 0.1–0.9)
Monocytes: 7 %
Neutrophils Absolute: 3.2 10*3/uL (ref 1.4–7.0)
Neutrophils: 57 %
Platelets: 192 10*3/uL (ref 150–450)
RBC: 4.54 x10E6/uL (ref 4.14–5.80)
RDW: 12.6 % (ref 11.6–15.4)
WBC: 5.6 10*3/uL (ref 3.4–10.8)

## 2019-11-17 LAB — COMPREHENSIVE METABOLIC PANEL
ALT: 41 IU/L (ref 0–44)
AST: 30 IU/L (ref 0–40)
Albumin/Globulin Ratio: 1.6 (ref 1.2–2.2)
Albumin: 4.6 g/dL (ref 3.8–4.8)
Alkaline Phosphatase: 52 IU/L (ref 39–117)
BUN/Creatinine Ratio: 19 (ref 10–24)
BUN: 18 mg/dL (ref 8–27)
Bilirubin Total: 0.4 mg/dL (ref 0.0–1.2)
CO2: 26 mmol/L (ref 20–29)
Calcium: 10 mg/dL (ref 8.6–10.2)
Chloride: 100 mmol/L (ref 96–106)
Creatinine, Ser: 0.93 mg/dL (ref 0.76–1.27)
GFR calc Af Amer: 101 mL/min/{1.73_m2} (ref >59)
GFR calc non Af Amer: 88 mL/min/{1.73_m2} (ref >59)
Globulin, Total: 2.9 g/dL (ref 1.5–4.5)
Glucose: 102 mg/dL — ABNORMAL HIGH (ref 65–99)
Potassium: 4.5 mmol/L (ref 3.5–5.2)
Sodium: 141 mmol/L (ref 134–144)
Total Protein: 7.5 g/dL (ref 6.0–8.5)

## 2019-11-17 LAB — PHENYTOIN LEVEL, TOTAL: Phenytoin (Dilantin), Serum: 13.8 ug/mL (ref 10.0–20.0)

## 2019-11-17 NOTE — Telephone Encounter (Signed)
-----   Message from Ward Givens, NP sent at 11/17/2019  1:19 PM EST ----- Labs results are unremarkable. Please call patient with results.

## 2019-11-17 NOTE — Telephone Encounter (Signed)
Spoke with the patient and he verbalized understanding his results. No questions or concerns at this time.   

## 2020-02-21 ENCOUNTER — Ambulatory Visit: Payer: 59 | Attending: Internal Medicine

## 2020-02-21 DIAGNOSIS — Z23 Encounter for immunization: Secondary | ICD-10-CM | POA: Insufficient documentation

## 2020-02-21 NOTE — Progress Notes (Signed)
   Covid-19 Vaccination Clinic  Name:  Jeffrey [name]    MRN: [id number] DOB: May 11, 1956  02/21/2020  [name] was observed post Covid-19 immunization for 15 minutes without incidence. He was provided with Vaccine Information Sheet and instruction to access the V-Safe system.   [name] was instructed to call 911 with any severe reactions post vaccine: Marland Kitchen Difficulty breathing  . Swelling of your face and throat  . A fast heartbeat  . A bad rash all over your body  . Dizziness and weakness    Immunizations Administered    Name Date Dose VIS Date Route   Pfizer COVID-19 Vaccine 02/21/2020 12:56 PM 0.3 mL 12/04/2019 Intramuscular   Manufacturer: [place]   Lot: [id number]   National: [id number]

## 2020-03-22 ENCOUNTER — Ambulatory Visit: Payer: 59 | Attending: Internal Medicine

## 2020-03-22 DIAGNOSIS — Z23 Encounter for immunization: Secondary | ICD-10-CM

## 2020-03-22 NOTE — Progress Notes (Signed)
   Covid-19 Vaccination Clinic  Name:  ABBAS TABOADA    MRN: [id number] DOB: [birth year]  03/22/2020  [name] was observed post Covid-19 immunization for 15 minutes without incident. He was provided with Vaccine Information Sheet and instruction to access the V-Safe system.   [name] was instructed to call 911 with any severe reactions post vaccine: Marland Kitchen Difficulty breathing  . Swelling of face and throat  . A fast heartbeat  . A bad rash all over body  . Dizziness and weakness   Immunizations Administered    Name Date Dose VIS Date Route   Pfizer COVID-19 Vaccine 03/22/2020 10:06 AM 0.3 mL 12/04/2019 Intramuscular   Manufacturer: [place]   Lot: [id number]   [place]: [id number]

## 2020-09-03 ENCOUNTER — Other Ambulatory Visit: Payer: Self-pay | Admitting: Neurology

## 2020-11-15 ENCOUNTER — Ambulatory Visit: Payer: 59 | Admitting: Adult Health

## 2021-02-23 ENCOUNTER — Other Ambulatory Visit: Payer: Self-pay

## 2021-02-23 ENCOUNTER — Encounter: Payer: Self-pay | Admitting: Adult Health

## 2021-02-23 ENCOUNTER — Ambulatory Visit: Payer: 59 | Admitting: Adult Health

## 2021-02-23 VITALS — BP 137/79 | HR 90 | Ht 73.5 in | Wt 206.0 lb

## 2021-02-23 DIAGNOSIS — Z5181 Encounter for therapeutic drug level monitoring: Secondary | ICD-10-CM | POA: Diagnosis not present

## 2021-02-23 DIAGNOSIS — G40309 Generalized idiopathic epilepsy and epileptic syndromes, not intractable, without status epilepticus: Secondary | ICD-10-CM

## 2021-02-23 DIAGNOSIS — R251 Tremor, unspecified: Secondary | ICD-10-CM | POA: Diagnosis not present

## 2021-02-23 MED ORDER — PHENYTOIN 50 MG PO CHEW: CHEWABLE_TABLET | ORAL | 3 refills | Status: DC

## 2021-02-23 MED ORDER — PHENYTOIN SODIUM EXTENDED 100 MG PO CAPS: ORAL_CAPSULE | ORAL | 3 refills | Status: DC

## 2021-02-23 NOTE — Progress Notes (Signed)
I have read the note, and I agree with the clinical assessment and plan.  Charles K Willis   

## 2021-02-23 NOTE — Patient Instructions (Signed)
Continue Dilantin 350 mg daily  Blood work today If you have any seizure events please let us know.

## 2021-02-23 NOTE — Progress Notes (Signed)
PATIENT: Jeffrey [name] DOB: [birth year]  REASON FOR VISIT: follow up HISTORY FROM: patient  HISTORY OF PRESENT ILLNESS: Today 02/23/21: [name] is a 65 year old male with a history of seizures.  He returns today for follow-up.He remains on Dilantin 350 mg daily.  He denies any seizure events.  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  He feels that his tremor is stable.  He continues to notice it with his handwriting and grabbing a cup.  He is feels that the Adderall makes it worse.  He feels that he is slightly off balance.  States that he has not had any falls.  But sometimes he will bump into the wall.  He returns today for evaluation.  11/16/19: [name] is a 65 year old male with a history of seizures.  He returns today for follow-up.  He remains on Dilantin.  He reports that approximately 2 to 3 months ago he was having an intense argument with his wife.  He states in the middle of the argument he blacked out.  He said that he was only out for approximately 10 seconds.  He did fall to the ground.  He did not have any convulsing in the arms or legs.  He did not lose bowel or bladder.  He states in the past his seizure events has included convulsing.  He is not had any additional events since that time.  He continues operating a motor vehicle.  He is able to complete all ADLs independently.  He does note that he has a tremor particularly when he is grabbing a cup, eating and with his handwriting.  He denies any family history of tremor.  He returns today for an evaluation  HISTORY 11/10/18:  [name] is a 65 year old male with a history of seizures.  He returns today for follow-up.  His seizures have been well controlled with Dilantin.  He continues taking Dilantin 350 mg at bedtime.  He denies any seizure events.  Denies any significant changes with his gait or balance.  He reports on occasion he will feel off balance.  Denies any  falls.  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  He returns today for evaluation  REVIEW OF SYSTEMS: Out of a complete 14 system review of symptoms, the patient complains only of the following symptoms, and all other reviewed systems are negative.  See HPI  ALLERGIES: No Known Allergies  HOME MEDICATIONS: Outpatient Medications Prior to Visit  Medication Sig Dispense Refill  . amphetamine-dextroamphetamine (ADDERALL) 20 MG tablet Take 10 mg by mouth 3 (three) times daily.     Marland Kitchen atorvastatin (LIPITOR) 20 MG tablet Take 20 mg at bedtime by mouth.  3  . citalopram (CELEXA) 40 MG tablet Take 1 tablet by mouth at bedtime.   1  . Multiple Vitamin (MULTIVITAMIN WITH MINERALS) TABS Take 1 tablet by mouth daily.    . phenytoin (DILANTIN) 100 MG ER capsule TAKE 3 CAPSULES(300 MG) BY MOUTH AT BEDTIME 270 capsule 3  . PHENYTOIN INFATABS 50 MG tablet CHEW AND SWALLOW 1 TABLET(50 MG) BY MOUTH DAILY 90 tablet 3  . cholecalciferol (VITAMIN D) 1000 units tablet Take 1,000 Units daily by mouth.     No facility-administered medications prior to visit.    PAST MEDICAL HISTORY: Past Medical History:  Diagnosis Date  . ADD (attention deficit disorder)   . Benign prostatic hypertrophy   .  Depression   . Seizures ([place])     PAST SURGICAL HISTORY: Past Surgical History:  Procedure Laterality Date  . APPENDECTOMY      FAMILY HISTORY: Family History  Problem Relation Age of Onset  . Prostate cancer Father   . Seizures Father   . Heart Problems Mother   . Stroke Sister   . Seizures Son     SOCIAL HISTORY: Social History   Socioeconomic History  . Marital status: Married    Spouse name: Not on file  . Number of children: 2  . Years of education: Not on file  . Highest education level: Not on file  Occupational History    Employer: [employer]  Tobacco Use  . Smoking status: Former Smoker    Types: Cigarettes  . Smokeless  tobacco: Never Used  . Tobacco comment: Quit 1977  Substance and Sexual Activity  . Alcohol use: Yes    Comment: rarely  . Drug use: No  . Sexual activity: Not on file  Other Topics Concern  . Not on file  Social History Narrative   Patient is divorced and works for [employer].    Education- college   Left handed.   Caffeine- None   Social Determinants of Health   Financial Resource Strain: Not on file  Food Insecurity: Not on file  Transportation Needs: Not on file  Physical Activity: Not on file  Stress: Not on file  Social Connections: Not on file  Intimate Partner Violence: Not on file      PHYSICAL EXAM  Vitals:   02/23/21 1301  BP: 137/79  Pulse: 90  Weight: 206 lb (93.4 kg)  Height: 6' 1.5" (1.867 m)   Body mass index is 26.81 kg/m.  Generalized: Well developed, in no acute distress   Neurological examination  Mentation: Alert oriented to time, place, history taking. Follows all commands speech and language fluent Cranial nerve II-XII: Pupils were equal round reactive to light. Extraocular movements were full, visual field were full on confrontational test.  Head turning and shoulder shrug  were normal and symmetric. Motor: The motor testing reveals 5 over 5 strength of all 4 extremities. Good symmetric motor tone is noted throughout.  Sensory: Sensory testing is intact to soft touch on all 4 extremities. No evidence of extinction is noted.  Coordination: Cerebellar testing reveals good finger-nose-finger and heel-to-shin bilaterally.  Mild tremor noticed with finger-nose-finger Gait and station: Gait is normal.  Reflexes: Deep tendon reflexes are symmetric and normal bilaterally.   DIAGNOSTIC DATA (LABS, IMAGING, TESTING) - I reviewed patient records, labs, notes, testing and imaging myself where available.  Lab Results  Component Value Date   WBC 5.6 11/16/2019   HGB 14.6 11/16/2019   HCT 43.6 11/16/2019   MCV 96 11/16/2019   PLT 192  11/16/2019      Component Value Date/Time   NA 141 11/16/2019 1103   K 4.5 11/16/2019 1103   CL 100 11/16/2019 1103   CO2 26 11/16/2019 1103   GLUCOSE 102 (H) 11/16/2019 1103   GLUCOSE 117 (H) 09/14/2009 1505   BUN 18 11/16/2019 1103   CREATININE 0.93 11/16/2019 1103   CALCIUM 10.0 11/16/2019 1103   PROT 7.5 11/16/2019 1103   ALBUMIN 4.6 11/16/2019 1103   AST 30 11/16/2019 1103   ALT 41 11/16/2019 1103   ALKPHOS 52 11/16/2019 1103   BILITOT 0.4 11/16/2019 1103   GFRNONAA 88 11/16/2019 1103   GFRAA 101 11/16/2019 1103  ASSESSMENT AND PLAN 65 y.o. year old male  has a past medical history of ADD (attention deficit disorder), Benign prostatic hypertrophy, Depression, and Seizures ([place]). here with:  1.  Seizures   --Continue Dilantin 350 mg daily --Blood work today --Advised to let us know if he has any seizure events   2.  Essential tremor  --Stable --Can consider medication in the future if tremor worsens--propranolol  I spent 30 minutes of face-to-face and non-face-to-face time with patient.  This included previsit chart review, lab review, study review, order entry, electronic health record documentation, patient education.    Ward Givens, MSN, NP-C 02/23/2021, 1:17 PM [facility] [address], [place], [place] [address] [phone]

## 2021-02-24 LAB — COMPREHENSIVE METABOLIC PANEL
ALT: 36 IU/L (ref 0–44)
AST: 26 IU/L (ref 0–40)
Albumin/Globulin Ratio: 1.7 (ref 1.2–2.2)
Albumin: 4.7 g/dL (ref 3.8–4.8)
Alkaline Phosphatase: 61 IU/L (ref 44–121)
BUN/Creatinine Ratio: 16 (ref 10–24)
BUN: 19 mg/dL (ref 8–27)
Bilirubin Total: 0.3 mg/dL (ref 0.0–1.2)
CO2: 25 mmol/L (ref 20–29)
Calcium: 9.7 mg/dL (ref 8.6–10.2)
Chloride: 103 mmol/L (ref 96–106)
Creatinine, Ser: 1.17 mg/dL (ref 0.76–1.27)
Globulin, Total: 2.7 g/dL (ref 1.5–4.5)
Glucose: 81 mg/dL (ref 65–99)
Potassium: 4.9 mmol/L (ref 3.5–5.2)
Sodium: 142 mmol/L (ref 134–144)
Total Protein: 7.4 g/dL (ref 6.0–8.5)
eGFR: 70 mL/min/{1.73_m2} (ref >59)

## 2021-02-24 LAB — CBC WITH DIFFERENTIAL/PLATELET
Basophils Absolute: 0 10*3/uL (ref 0.0–0.2)
Basos: 0 %
EOS (ABSOLUTE): 0.2 10*3/uL (ref 0.0–0.4)
Eos: 3 %
Hematocrit: 41.5 % (ref 37.5–51.0)
Hemoglobin: 14.4 g/dL (ref 13.0–17.7)
Immature Grans (Abs): 0 10*3/uL (ref 0.0–0.1)
Immature Granulocytes: 0 %
Lymphocytes Absolute: 1.7 10*3/uL (ref 0.7–3.1)
Lymphs: 29 %
MCH: 33.6 pg — ABNORMAL HIGH (ref 26.6–33.0)
MCHC: 34.7 g/dL (ref 31.5–35.7)
MCV: 97 fL (ref 79–97)
Monocytes Absolute: 0.5 10*3/uL (ref 0.1–0.9)
Monocytes: 8 %
Neutrophils Absolute: 3.6 10*3/uL (ref 1.4–7.0)
Neutrophils: 60 %
Platelets: 176 10*3/uL (ref 150–450)
RBC: 4.29 x10E6/uL (ref 4.14–5.80)
RDW: 12.3 % (ref 11.6–15.4)
WBC: 6 10*3/uL (ref 3.4–10.8)

## 2021-02-24 LAB — PHENYTOIN LEVEL, TOTAL: Phenytoin (Dilantin), Serum: 19.5 ug/mL (ref 10.0–20.0)

## 2021-02-27 ENCOUNTER — Encounter: Payer: Self-pay | Admitting: Adult Health

## 2021-06-01 ENCOUNTER — Telehealth: Payer: Self-pay | Admitting: Adult Health

## 2021-06-01 NOTE — Telephone Encounter (Signed)
Megan notified (relayed ok).  I called pt and let him know as well.  He appreciated call back.

## 2021-06-01 NOTE — Telephone Encounter (Signed)
[facility]) called stating they have to switch to another manufacturer for phenytoin (PHENYTOIN INFATABS) 50 MG tablet and need permission to do so. Would like a call from the nurse

## 2021-06-01 NOTE — Telephone Encounter (Signed)
I called [facility] and let pharmacist know that this was ok to do change manufacturer for phenytoin infatabs.

## 2021-06-12 NOTE — Telephone Encounter (Signed)
Called, LVM relaying MM,NP message. Asked him to call back to schedule a lab appt.

## 2021-06-12 NOTE — Telephone Encounter (Signed)
Called pt back. He thinks he got old manufacturer for recent fill last week. He is going to call pharmacy to verify. Manufacturer on bottle different than what pill says he thinks. He will call back w/ update once he speaks w/ his pharmacy.

## 2021-06-12 NOTE — Telephone Encounter (Signed)
Please let patient know that once he receives the tablets with a new manufacture he will need to come back into the office in 1 to 2 weeks to check a Dilantin level

## 2021-06-12 NOTE — Telephone Encounter (Signed)
Pt is asking for a call back

## 2021-06-14 NOTE — Telephone Encounter (Signed)
I called the patient again. He went to the pharmacy yesterday. They confirmed he is taking tablets from the same manufacturer as past prescriptions. If this changes, he will notify our office so we can arrange to recheck labs.

## 2021-12-11 ENCOUNTER — Telehealth: Payer: Self-pay | Admitting: Adult Health

## 2021-12-11 NOTE — Telephone Encounter (Signed)
Pt requesting refill for phenytoin (PHENYTOIN INFATABS) 50 MG tablet. Pharmacy  [facility] [address] [place] [facility] [address]

## 2021-12-11 NOTE — Telephone Encounter (Signed)
I spoke with [facility]. They have another refill on file of the Phenytoin 50 mg and they will fill this for the patient now.

## 2021-12-11 NOTE — Telephone Encounter (Signed)
Best to stay with the current manufacturer if possible.  If that is not possible we just need to make the patient aware that when the manufacturer of the medication changes there is a slight increased risk of breakthrough seizure.

## 2021-12-11 NOTE — Telephone Encounter (Signed)
Saima with [facility] called stating they are needing approval to change the manufacturing for the  phenytoin (PHENYTOIN INFATABS) 50 MG tablet. Eunice Blase is requesting a call back.

## 2021-12-12 ENCOUNTER — Other Ambulatory Visit: Payer: Self-pay | Admitting: *Deleted

## 2021-12-12 MED ORDER — PHENYTOIN 50 MG PO CHEW: CHEWABLE_TABLET | ORAL | 3 refills | Status: DC

## 2021-12-12 MED ORDER — PHENYTOIN 50 MG PO CHEW: CHEWABLE_TABLET | ORAL | 0 refills | Status: DC

## 2021-12-12 NOTE — Telephone Encounter (Signed)
I [facility] and they donot have his generic Elray Buba or able to get it. I called pt and he is ok to change to another generic and is aware of risk of breakthru sz.  He did ask about taking another 100mg  capsule every other day (if that would work)?  I did not think that an option but he wanted me to ask.  He is out of his medication.

## 2021-12-12 NOTE — Addendum Note (Signed)
Addended by: Brandon Melnick on: 12/12/2021 03:39 PM   Modules accepted: Orders

## 2021-12-12 NOTE — Telephone Encounter (Signed)
I called and LMVM for pt concerning backorder of his generic Elray Buba) phenytoin infatabs for his seizures.  No [facility] have it per pharmacist (not sure when it might be available). He may [facility] or [facility] to see if they have. (Transfer prescription).  If not change to another generic, maybe at risk for breakthru sz. He is to call back to let us know what he would like to do.

## 2021-12-12 NOTE — Telephone Encounter (Signed)
I typically do not increase the dose.

## 2022-02-26 ENCOUNTER — Ambulatory Visit: Payer: 59 | Admitting: Adult Health

## 2022-02-28 ENCOUNTER — Ambulatory Visit: Payer: 59 | Admitting: Adult Health

## 2022-02-28 ENCOUNTER — Other Ambulatory Visit: Payer: Self-pay | Admitting: Adult Health

## 2022-02-28 VITALS — BP 141/81 | HR 81 | Ht 73.0 in | Wt 203.0 lb

## 2022-02-28 DIAGNOSIS — G40309 Generalized idiopathic epilepsy and epileptic syndromes, not intractable, without status epilepticus: Secondary | ICD-10-CM | POA: Diagnosis not present

## 2022-02-28 DIAGNOSIS — Z5181 Encounter for therapeutic drug level monitoring: Secondary | ICD-10-CM

## 2022-02-28 DIAGNOSIS — R251 Tremor, unspecified: Secondary | ICD-10-CM | POA: Diagnosis not present

## 2022-02-28 NOTE — Progress Notes (Signed)
? ? ?PATIENT: Jeffrey [name] ?DOB: Jun 27, 1956 ? ?REASON FOR VISIT: follow up ?HISTORY FROM: patient ? ?HISTORY OF PRESENT ILLNESS: ?Today 02/28/22: ? ?Jeffrey [name] is a 66 year old male with a history of seizures.  He returns today for follow-up.  He remains on Dilantin 350 mg daily.  Denies any seizures.  He operates a Teacher, music without difficulty.  The patient feels that his tremor has gotten slightly worse.  More so in the left hand.  He notices that with his writing and sometimes with eating.  He denies any changes with his gait or balance.  He does not particularly want to be on medication for his tremor.  He feels that it is manageable at this time ? ? ?02/23/21: Jeffrey [name] is a 66 year old male with a history of seizures.  He returns today for follow-up.He remains on Dilantin 350 mg daily.  He denies any seizure events.  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  He feels that his tremor is stable.  He continues to notice it with his handwriting and grabbing a cup.  He is feels that the Adderall makes it worse.  He feels that he is slightly off balance.  States that he has not had any falls.  But sometimes he will bump into the wall.  He returns today for evaluation. ? ?11/16/19: Jeffrey [name] is a 66 year old male with a history of seizures.  He returns today for follow-up.  He remains on Dilantin.  He reports that approximately 2 to 3 months ago he was having an intense argument with his wife.  He states in the middle of the argument he blacked out.  He said that he was only out for approximately 10 seconds.  He did fall to the ground.  He did not have any convulsing in the arms or legs.  He did not lose bowel or bladder.  He states in the past his seizure events has included convulsing.  He is not had any additional events since that time.  He continues operating a motor vehicle.  He is able to complete all ADLs independently.  He does note that he has a  tremor particularly when he is grabbing a cup, eating and with his handwriting.  He denies any family history of tremor.  He returns today for an evaluation ? ?HISTORY 11/10/18: ?  ?Jeffrey [name] is a 66 year old male with a history of seizures.  He returns today for follow-up.  His seizures have been well controlled with Dilantin.  He continues taking Dilantin 350 mg at bedtime.  He denies any seizure events.  Denies any significant changes with his gait or balance.  He reports on occasion he will feel off balance.  Denies any falls.  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  He returns today for evaluation ? ?REVIEW OF SYSTEMS: Out of a complete 14 system review of symptoms, the patient complains only of the following symptoms, and all other reviewed systems are negative. ? ?See HPI ? ?ALLERGIES: ?No Known Allergies ? ?HOME MEDICATIONS: ?Outpatient Medications Prior to Visit  ?Medication Sig Dispense Refill  ? amphetamine-dextroamphetamine (ADDERALL) 20 MG tablet Take 10 mg by mouth 3 (three) times daily.     ? atorvastatin (LIPITOR) 20 MG tablet Take 20 mg at bedtime by mouth.  3  ? citalopram (CELEXA) 40 MG tablet Take 1 tablet by mouth at bedtime.  1  ? Multiple Vitamin (MULTIVITAMIN WITH MINERALS) TABS Take 1 tablet by mouth daily.    ? phenytoin (DILANTIN) 100 MG ER capsule TAKE 3 CAPSULES(300 MG) BY MOUTH AT BEDTIME 270 capsule 3  ? phenytoin (PHENYTOIN INFATABS) 50 MG tablet CHEW AND SWALLOW 1 TABLET(50 MG) BY MOUTH DAILY 90 tablet 3  ? ?No facility-administered medications prior to visit.  ? ? ?PAST MEDICAL HISTORY: ?Past Medical History:  ?Diagnosis Date  ? ADD (attention deficit disorder)   ? Benign prostatic hypertrophy   ? Depression   ? Seizures ([place])   ? ? ?PAST SURGICAL HISTORY: ?Past Surgical History:  ?Procedure Laterality Date  ? APPENDECTOMY    ? ? ?FAMILY HISTORY: ?Family History  ?Problem Relation Age of Onset  ? Prostate cancer Father   ?  Seizures Father   ? Heart Problems Mother   ? Stroke Sister   ? Seizures Son   ? ? ?SOCIAL HISTORY: ?Social History  ? ?Socioeconomic History  ? Marital status: Married  ?  Spouse name: Not on file  ? Number of children: 2  ? Years of education: Not on file  ? Highest education level: Not on file  ?Occupational History  ?  Employer: [employer]  ?Tobacco Use  ? Smoking status: Former  ?  Types: Cigarettes  ? Smokeless tobacco: Never  ? Tobacco comments:  ?  Quit 1977  ?Substance and Sexual Activity  ? Alcohol use: Yes  ?  Comment: rarely  ? Drug use: No  ? Sexual activity: Not on file  ?Other Topics Concern  ? Not on file  ?Social History Narrative  ? Patient is divorced and works for [employer].   ? Education- college  ? Left handed.  ? Caffeine- None  ? ?Social Determinants of Health  ? ?Financial Resource Strain: Not on file  ?Food Insecurity: Not on file  ?Transportation Needs: Not on file  ?Physical Activity: Not on file  ?Stress: Not on file  ?Social Connections: Not on file  ?Intimate Partner Violence: Not on file  ? ? ? ? ?PHYSICAL EXAM ? ?Vitals:  ? 02/28/22 0925  ?BP: (!) 141/81  ?Pulse: 81  ?Weight: 203 lb (92.1 kg)  ?Height: '6\' 1"'$  (1.854 m)  ? ? ?Body mass index is 26.78 kg/m?. ? ?Generalized: Well developed, in no acute distress  ? ?Neurological examination  ?Mentation: Alert oriented to time, place, history taking. Follows all commands speech and language fluent ?Cranial nerve II-XII: Pupils were equal round reactive to light. Extraocular movements were full, visual field were full on confrontational test.  Head turning and shoulder shrug  were normal and symmetric. ?Motor: The motor testing reveals 5 over 5 strength of all 4 extremities. Good symmetric motor tone is noted throughout.  ?Sensory: Sensory testing is intact to soft touch on all 4 extremities. No evidence of extinction is noted.  ?Coordination: Cerebellar testing reveals good finger-nose-finger and heel-to-shin bilaterally.   Mild tremor noticed with finger-nose-finger left greater than right ?Gait and station: Patient is able to stand without assistance.  Good stride and arm swing.  2 steps with turns. ?Reflexes: Deep tendon reflexes are symmetric and normal bilaterally.  ? ?DIAGNOSTIC DATA (LABS, IMAGING, TESTING) ?- I reviewed patient records, labs, notes, testing and imaging myself where available. ? ?Lab Results  ?Component Value Date  ? WBC 6.0 02/23/2021  ? HGB 14.4 02/23/2021  ? HCT 41.5 02/23/2021  ? MCV 97 02/23/2021  ? PLT 176 02/23/2021  ? ?   ?  Component Value Date/Time  ? NA 142 02/23/2021 1333  ? K 4.9 02/23/2021 1333  ? CL 103 02/23/2021 1333  ? CO2 25 02/23/2021 1333  ? GLUCOSE 81 02/23/2021 1333  ? GLUCOSE 117 (H) 09/14/2009 1505  ? BUN 19 02/23/2021 1333  ? CREATININE 1.17 02/23/2021 1333  ? CALCIUM 9.7 02/23/2021 1333  ? PROT 7.4 02/23/2021 1333  ? ALBUMIN 4.7 02/23/2021 1333  ? AST 26 02/23/2021 1333  ? ALT 36 02/23/2021 1333  ? ALKPHOS 61 02/23/2021 1333  ? BILITOT 0.3 02/23/2021 1333  ? GFRNONAA 88 11/16/2019 1103  ? GFRAA 101 11/16/2019 1103  ? ? ? ? ?ASSESSMENT AND PLAN ?66 y.o. year old male  has a past medical history of ADD (attention deficit disorder), Benign prostatic hypertrophy, Depression, and Seizures ([place]). here with: ? ?1.  Seizures  ? ?--Continue Dilantin 350 mg daily ?--Blood work today ?--Advised to let us know if he has any seizure events ? ? ?2.  Essential tremor ? ?--Stable ?--Can consider medication in the future if tremor worsens--propranolol or primidone ?-- Patient concerned about possible Parkinson disease however physical exam does not show any other symptoms consistent with Parkinson's disease at this time. ? ?Patient was previously established with Dr. Jannifer Franklin.  He will now be transition to Dr. April Manson since Dr. Jannifer Franklin has retired ? ? ? ? ?Ward Givens, MSN, NP-C 02/28/2022, 9:17 AM ?[facility] ?[address], [address] ?[place], [place] [address] ?[phone] ? ? ?

## 2022-02-28 NOTE — Patient Instructions (Signed)
Your Plan: ? ?Continue Dilantin  ?Blood work today ?If your symptoms worsen or you develop new symptoms please let us know.  ? ? ?Thank you for coming to see [place] at [facility]. I hope we have been able to provide you high quality care today. ? ?You may receive a patient satisfaction survey over the next few weeks. We would appreciate your feedback and comments so that we may continue to improve ourselves and the health of our patients. ? ?

## 2022-03-01 LAB — CBC WITH DIFFERENTIAL/PLATELET
Basophils Absolute: 0 10*3/uL (ref 0.0–0.2)
Basos: 0 %
EOS (ABSOLUTE): 0.2 10*3/uL (ref 0.0–0.4)
Eos: 3 %
Hematocrit: 42.9 % (ref 37.5–51.0)
Hemoglobin: 14.8 g/dL (ref 13.0–17.7)
Immature Grans (Abs): 0 10*3/uL (ref 0.0–0.1)
Immature Granulocytes: 0 %
Lymphocytes Absolute: 1.6 10*3/uL (ref 0.7–3.1)
Lymphs: 31 %
MCH: 33.2 pg — ABNORMAL HIGH (ref 26.6–33.0)
MCHC: 34.5 g/dL (ref 31.5–35.7)
MCV: 96 fL (ref 79–97)
Monocytes Absolute: 0.4 10*3/uL (ref 0.1–0.9)
Monocytes: 7 %
Neutrophils Absolute: 3 10*3/uL (ref 1.4–7.0)
Neutrophils: 59 %
Platelets: 189 10*3/uL (ref 150–450)
RBC: 4.46 x10E6/uL (ref 4.14–5.80)
RDW: 12.4 % (ref 11.6–15.4)
WBC: 5.2 10*3/uL (ref 3.4–10.8)

## 2022-03-01 LAB — COMPREHENSIVE METABOLIC PANEL
ALT: 37 IU/L (ref 0–44)
AST: 35 IU/L (ref 0–40)
Albumin/Globulin Ratio: 2 (ref 1.2–2.2)
Albumin: 5.1 g/dL — ABNORMAL HIGH (ref 3.8–4.8)
Alkaline Phosphatase: 50 IU/L (ref 44–121)
BUN/Creatinine Ratio: 19 (ref 10–24)
BUN: 20 mg/dL (ref 8–27)
Bilirubin Total: 0.4 mg/dL (ref 0.0–1.2)
CO2: 26 mmol/L (ref 20–29)
Calcium: 9.7 mg/dL (ref 8.6–10.2)
Chloride: 102 mmol/L (ref 96–106)
Creatinine, Ser: 1.04 mg/dL (ref 0.76–1.27)
Globulin, Total: 2.6 g/dL (ref 1.5–4.5)
Glucose: 89 mg/dL (ref 70–99)
Potassium: 4.6 mmol/L (ref 3.5–5.2)
Sodium: 142 mmol/L (ref 134–144)
Total Protein: 7.7 g/dL (ref 6.0–8.5)
eGFR: 80 mL/min/{1.73_m2} (ref >59)

## 2022-03-01 LAB — PHENYTOIN LEVEL, TOTAL: Phenytoin (Dilantin), Serum: 19.1 ug/mL (ref 10.0–20.0)

## 2022-07-04 DIAGNOSIS — R972 Elevated prostate specific antigen [PSA]: Secondary | ICD-10-CM | POA: Diagnosis not present

## 2022-07-25 DIAGNOSIS — R310 Gross hematuria: Secondary | ICD-10-CM | POA: Diagnosis not present

## 2022-07-25 DIAGNOSIS — R351 Nocturia: Secondary | ICD-10-CM | POA: Diagnosis not present

## 2022-07-25 DIAGNOSIS — R3912 Poor urinary stream: Secondary | ICD-10-CM | POA: Diagnosis not present

## 2022-07-25 DIAGNOSIS — R972 Elevated prostate specific antigen [PSA]: Secondary | ICD-10-CM | POA: Diagnosis not present

## 2022-08-17 DIAGNOSIS — H9313 Tinnitus, bilateral: Secondary | ICD-10-CM | POA: Diagnosis not present

## 2022-08-17 DIAGNOSIS — N400 Enlarged prostate without lower urinary tract symptoms: Secondary | ICD-10-CM | POA: Diagnosis not present

## 2022-08-17 DIAGNOSIS — D122 Benign neoplasm of ascending colon: Secondary | ICD-10-CM | POA: Diagnosis not present

## 2022-08-17 DIAGNOSIS — Z79899 Other long term (current) drug therapy: Secondary | ICD-10-CM | POA: Diagnosis not present

## 2022-08-17 DIAGNOSIS — Z8042 Family history of malignant neoplasm of prostate: Secondary | ICD-10-CM | POA: Diagnosis not present

## 2022-08-17 DIAGNOSIS — K5730 Diverticulosis of large intestine without perforation or abscess without bleeding: Secondary | ICD-10-CM | POA: Diagnosis not present

## 2022-08-17 DIAGNOSIS — E7800 Pure hypercholesterolemia, unspecified: Secondary | ICD-10-CM | POA: Diagnosis not present

## 2022-08-17 DIAGNOSIS — F3342 Major depressive disorder, recurrent, in full remission: Secondary | ICD-10-CM | POA: Diagnosis not present

## 2022-08-17 DIAGNOSIS — G40309 Generalized idiopathic epilepsy and epileptic syndromes, not intractable, without status epilepticus: Secondary | ICD-10-CM | POA: Diagnosis not present

## 2022-08-17 DIAGNOSIS — Z0000 Encounter for general adult medical examination without abnormal findings: Secondary | ICD-10-CM | POA: Diagnosis not present

## 2022-08-17 DIAGNOSIS — G252 Other specified forms of tremor: Secondary | ICD-10-CM | POA: Diagnosis not present

## 2022-08-17 DIAGNOSIS — R972 Elevated prostate specific antigen [PSA]: Secondary | ICD-10-CM | POA: Diagnosis not present

## 2022-08-17 DIAGNOSIS — I7300 Raynaud's syndrome without gangrene: Secondary | ICD-10-CM | POA: Diagnosis not present

## 2022-08-20 ENCOUNTER — Other Ambulatory Visit: Payer: Self-pay | Admitting: Internal Medicine

## 2022-08-20 DIAGNOSIS — Z79899 Other long term (current) drug therapy: Secondary | ICD-10-CM

## 2022-08-25 DIAGNOSIS — L0201 Cutaneous abscess of face: Secondary | ICD-10-CM | POA: Diagnosis not present

## 2022-08-29 DIAGNOSIS — L720 Epidermal cyst: Secondary | ICD-10-CM | POA: Diagnosis not present

## 2022-09-04 DIAGNOSIS — F902 Attention-deficit hyperactivity disorder, combined type: Secondary | ICD-10-CM | POA: Diagnosis not present

## 2022-09-04 DIAGNOSIS — F3289 Other specified depressive episodes: Secondary | ICD-10-CM | POA: Diagnosis not present

## 2022-09-05 DIAGNOSIS — F3342 Major depressive disorder, recurrent, in full remission: Secondary | ICD-10-CM | POA: Diagnosis not present

## 2022-09-05 DIAGNOSIS — K5900 Constipation, unspecified: Secondary | ICD-10-CM | POA: Diagnosis not present

## 2022-09-05 DIAGNOSIS — F1591 Other stimulant use, unspecified, in remission: Secondary | ICD-10-CM | POA: Diagnosis not present

## 2022-09-05 DIAGNOSIS — E785 Hyperlipidemia, unspecified: Secondary | ICD-10-CM | POA: Diagnosis not present

## 2022-09-05 DIAGNOSIS — Z87891 Personal history of nicotine dependence: Secondary | ICD-10-CM | POA: Diagnosis not present

## 2022-09-05 DIAGNOSIS — E663 Overweight: Secondary | ICD-10-CM | POA: Diagnosis not present

## 2022-09-05 DIAGNOSIS — Z9849 Cataract extraction status, unspecified eye: Secondary | ICD-10-CM | POA: Diagnosis not present

## 2022-09-05 DIAGNOSIS — G40909 Epilepsy, unspecified, not intractable, without status epilepticus: Secondary | ICD-10-CM | POA: Diagnosis not present

## 2022-09-05 DIAGNOSIS — N400 Enlarged prostate without lower urinary tract symptoms: Secondary | ICD-10-CM | POA: Diagnosis not present

## 2022-09-05 DIAGNOSIS — F909 Attention-deficit hyperactivity disorder, unspecified type: Secondary | ICD-10-CM | POA: Diagnosis not present

## 2022-09-10 ENCOUNTER — Ambulatory Visit
Admission: RE | Admit: 2022-09-10 | Discharge: 2022-09-10 | Disposition: A | Discharge status: Home / Self Care | Payer: PPO | Source: Ambulatory Visit | Attending: Internal Medicine | Admitting: Internal Medicine

## 2022-09-10 DIAGNOSIS — Z79899 Other long term (current) drug therapy: Secondary | ICD-10-CM

## 2022-09-10 DIAGNOSIS — M85852 Other specified disorders of bone density and structure, left thigh: Secondary | ICD-10-CM | POA: Diagnosis not present

## 2022-11-28 ENCOUNTER — Other Ambulatory Visit: Payer: Self-pay | Admitting: Adult Health

## 2023-01-02 DIAGNOSIS — F902 Attention-deficit hyperactivity disorder, combined type: Secondary | ICD-10-CM | POA: Diagnosis not present

## 2023-01-02 DIAGNOSIS — F3289 Other specified depressive episodes: Secondary | ICD-10-CM | POA: Diagnosis not present

## 2023-02-21 DIAGNOSIS — F3342 Major depressive disorder, recurrent, in full remission: Secondary | ICD-10-CM | POA: Diagnosis not present

## 2023-02-21 DIAGNOSIS — E7800 Pure hypercholesterolemia, unspecified: Secondary | ICD-10-CM | POA: Diagnosis not present

## 2023-02-21 DIAGNOSIS — R030 Elevated blood-pressure reading, without diagnosis of hypertension: Secondary | ICD-10-CM | POA: Diagnosis not present

## 2023-02-21 DIAGNOSIS — G250 Essential tremor: Secondary | ICD-10-CM | POA: Diagnosis not present

## 2023-02-21 DIAGNOSIS — G40309 Generalized idiopathic epilepsy and epileptic syndromes, not intractable, without status epilepticus: Secondary | ICD-10-CM | POA: Diagnosis not present

## 2023-02-26 ENCOUNTER — Other Ambulatory Visit: Payer: Self-pay | Admitting: Adult Health

## 2023-03-01 ENCOUNTER — Other Ambulatory Visit: Payer: Self-pay | Admitting: Adult Health

## 2023-03-06 ENCOUNTER — Encounter: Payer: Self-pay | Admitting: Neurology

## 2023-03-06 ENCOUNTER — Ambulatory Visit: Payer: PPO | Admitting: Neurology

## 2023-03-06 VITALS — BP 111/71 | HR 73 | Ht 73.0 in | Wt 195.0 lb

## 2023-03-06 DIAGNOSIS — G250 Essential tremor: Secondary | ICD-10-CM | POA: Diagnosis not present

## 2023-03-06 DIAGNOSIS — G40309 Generalized idiopathic epilepsy and epileptic syndromes, not intractable, without status epilepticus: Secondary | ICD-10-CM

## 2023-03-06 DIAGNOSIS — Z5181 Encounter for therapeutic drug level monitoring: Secondary | ICD-10-CM

## 2023-03-06 MED ORDER — PHENYTOIN 50 MG PO CHEW: CHEWABLE_TABLET | ORAL | 3 refills | Status: DC

## 2023-03-06 MED ORDER — PHENYTOIN SODIUM EXTENDED 100 MG PO CAPS: ORAL_CAPSULE | ORAL | 3 refills | Status: DC

## 2023-03-06 NOTE — Progress Notes (Signed)
PATIENT: Jeffrey [name] DOB: [birth year]  REASON FOR VISIT: follow up HISTORY FROM: patient  HISTORY OF PRESENT ILLNESS: Today 03/06/23: Patient presents today for follow-up, he is alone.  Last visit was a year ago.  Since then he has not had any seizures or seizure-like activity.  He is compliant with his Dilantin 350 mg daily, denies any side effect from the medicine.  He reports that he was started on propranolol 10 mg daily for his essential tremor by PCP and it seems to control his symptoms.  Otherwise doing well no current concerns.  Interval history 03/01/2023: MM Jeffrey [name] is a 67 year old male with a history of seizures.  He returns today for follow-up.  He remains on Dilantin 350 mg daily.  Denies any seizures.  He operates a Teacher, music without difficulty.  The patient feels that his tremor has gotten slightly worse.  More so in the left hand.  He notices that with his writing and sometimes with eating.  He denies any changes with his gait or balance.  He does not particularly want to be on medication for his tremor.  He feels that it is manageable at this time   02/23/21: Jeffrey [name] is a 67 year old male with a history of seizures.  He returns today for follow-up.He remains on Dilantin 350 mg daily.  He denies any seizure events.  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  He feels that his tremor is stable.  He continues to notice it with his handwriting and grabbing a cup.  He is feels that the Adderall makes it worse.  He feels that he is slightly off balance.  States that he has not had any falls.  But sometimes he will bump into the wall.  He returns today for evaluation.  11/16/19: Jeffrey [name] is a 67 year old male with a history of seizures.  He returns today for follow-up.  He remains on Dilantin.  He reports that approximately 2 to 3 months ago he was having an intense argument with his wife.  He states in the middle of the  argument he blacked out.  He said that he was only out for approximately 10 seconds.  He did fall to the ground.  He did not have any convulsing in the arms or legs.  He did not lose bowel or bladder.  He states in the past his seizure events has included convulsing.  He is not had any additional events since that time.  He continues operating a motor vehicle.  He is able to complete all ADLs independently.  He does note that he has a tremor particularly when he is grabbing a cup, eating and with his handwriting.  He denies any family history of tremor.  He returns today for an evaluation  HISTORY 11/10/18:   Jeffrey [name] is a 67 year old male with a history of seizures.  He returns today for follow-up.  His seizures have been well controlled with Dilantin.  He continues taking Dilantin 350 mg at bedtime.  He denies any seizure events.  Denies any significant changes with his gait or balance.  He reports on occasion he will feel off balance.  Denies any falls.  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  He returns today for evaluation  REVIEW OF SYSTEMS: Out of a complete 14 system review of symptoms, the patient complains only of the following  symptoms, and all other reviewed systems are negative.  See HPI  ALLERGIES: No Known Allergies  HOME MEDICATIONS: Outpatient Medications Prior to Visit  Medication Sig Dispense Refill   amphetamine-dextroamphetamine (ADDERALL) 20 MG tablet Take 10 mg by mouth 3 (three) times daily.      atorvastatin (LIPITOR) 20 MG tablet Take 20 mg at bedtime by mouth.  3   citalopram (CELEXA) 40 MG tablet Take 1 tablet by mouth at bedtime.   1   Multiple Vitamin (MULTIVITAMIN WITH MINERALS) TABS Take 1 tablet by mouth daily.     propranolol (INDERAL) 10 MG tablet 1 tablet Orally Once a day, as needed for 30 days     phenytoin (DILANTIN) 100 MG ER capsule TAKE 3 CAPSULES(300 MG) BY MOUTH AT BEDTIME 270 capsule 3    phenytoin (PHENYTOIN INFATABS) 50 MG tablet CHEW AND SWALLOW 1 TABLET BY MOUTH EVERY DAY 30 tablet 0   No facility-administered medications prior to visit.    PAST MEDICAL HISTORY: Past Medical History:  Diagnosis Date   ADD (attention deficit disorder)    Benign prostatic hypertrophy    Depression    Seizures ([place])     PAST SURGICAL HISTORY: Past Surgical History:  Procedure Laterality Date   APPENDECTOMY      FAMILY HISTORY: Family History  Problem Relation Age of Onset   Prostate cancer Father    Seizures Father    Heart Problems Mother    Stroke Sister    Seizures Son     SOCIAL HISTORY: Social History   Socioeconomic History   Marital status: Married    Spouse name: Not on file   Number of children: 2   Years of education: Not on file   Highest education level: Not on file  Occupational History    Employer: [employer]  Tobacco Use   Smoking status: Former    Types: Cigarettes   Smokeless tobacco: Never   Tobacco comments:    Quit 1977  Substance and Sexual Activity   Alcohol use: Yes    Comment: rarely   Drug use: No   Sexual activity: Not on file  Other Topics Concern   Not on file  Social History Narrative   Patient is divorced and works for [employer].    Education- college   Left handed.   Caffeine- None   Social Determinants of Health   Financial Resource Strain: Low Risk  (11/17/2018)   Overall Financial Resource Strain (CARDIA)    Difficulty of Paying Living Expenses: Not hard at all  Food Insecurity: Unknown (11/17/2018)   Hunger Vital Sign    Worried About Running Out of Food in the Last Year: Patient refused    [place] in the Last Year: Patient refused  Transportation Needs: Unknown (11/17/2018)   PRAPARE - Hydrologist (Medical): Patient refused    Lack of Transportation (Non-Medical): Patient refused  Physical Activity: Unknown (11/17/2018)   Exercise Vital Sign    Days of  Exercise per Week: Patient refused    Minutes of Exercise per Session: Patient refused  Stress: Unknown (11/17/2018)   [place]    Feeling of Stress : Patient refused  Social Connections: Unknown (11/17/2018)   Social Connection and Isolation Panel [NHANES]    Frequency of Communication with Friends and Family: Patient refused    Frequency of Social Gatherings with Friends and Family: Patient refused  Attends Religious Services: Patient refused    Active Member of Clubs or Organizations: Patient refused    Attends Archivist Meetings: Patient refused    Marital Status: Patient refused  Intimate Partner Violence: Unknown (11/17/2018)   Humiliation, Afraid, Rape, and Kick questionnaire    Fear of Current or Ex-Partner: Patient refused    Emotionally Abused: Patient refused    Physically Abused: Patient refused    Sexually Abused: Patient refused      PHYSICAL EXAM  Vitals:   03/06/23 0923  BP: 111/71  Pulse: 73  Weight: 195 lb (88.5 kg)  Height: '6\' 1"'$  (1.854 m)     Body mass index is 25.73 kg/m.  Generalized: Well developed, in no acute distress   Neurological examination  Mentation: Alert oriented to time, place, history taking. Follows all commands speech and language fluent Cranial nerve II-XII: Pupils were equal round reactive to light. Extraocular movements were full, visual field were full on confrontational test.  Head turning and shoulder shrug  were normal and symmetric. Motor: The motor testing reveals 5 over 5 strength of all 4 extremities. Good symmetric motor tone is noted throughout.  Sensory: Sensory testing is intact to soft touch on all 4 extremities. No evidence of extinction is noted.  Coordination: Cerebellar testing reveals good finger-nose-finger and heel-to-shin bilaterally.  No action tremor noted on exam today.  Gait and station: Patient is able to stand without  assistance.  Good stride and arm swing.  2 steps with turns. Reflexes: Deep tendon reflexes are symmetric and normal bilaterally.   DIAGNOSTIC DATA (LABS, IMAGING, TESTING) - I reviewed patient records, labs, notes, testing and imaging myself where available.  Lab Results  Component Value Date   WBC 5.2 02/28/2022   HGB 14.8 02/28/2022   HCT 42.9 02/28/2022   MCV 96 02/28/2022   PLT 189 02/28/2022      Component Value Date/Time   NA 142 02/28/2022 0948   K 4.6 02/28/2022 0948   CL 102 02/28/2022 0948   CO2 26 02/28/2022 0948   GLUCOSE 89 02/28/2022 0948   GLUCOSE 117 (H) 09/14/2009 1505   BUN 20 02/28/2022 0948   CREATININE 1.04 02/28/2022 0948   CALCIUM 9.7 02/28/2022 0948   PROT 7.7 02/28/2022 0948   ALBUMIN 5.1 (H) 02/28/2022 0948   AST 35 02/28/2022 0948   ALT 37 02/28/2022 0948   ALKPHOS 50 02/28/2022 0948   BILITOT 0.4 02/28/2022 0948   GFRNONAA 88 11/16/2019 1103   GFRAA 101 11/16/2019 1103      ASSESSMENT AND PLAN 67 y.o. year old male  has a past medical history of ADD (attention deficit disorder), Benign prostatic hypertrophy, Depression, and Seizures ([place]). here with:  1.  Seizures   --Continue Dilantin 350 mg daily --Blood work today --Advised to let us know if he has any seizure events   2.  Essential tremor  --Improved --Continue with propanolol 10 mg daily, can increase to 10 BID or TID to control the symptoms in the future. --Follow up in 1 year with Ward Givens NP    Alric Ran, MD  03/06/2023, 9:44 AM [facility] [address], [place], [place] [address] [phone]

## 2023-03-07 LAB — COMPREHENSIVE METABOLIC PANEL
ALT: 32 IU/L (ref 0–44)
AST: 28 IU/L (ref 0–40)
Albumin/Globulin Ratio: 2 (ref 1.2–2.2)
Albumin: 4.9 g/dL (ref 3.9–4.9)
Alkaline Phosphatase: 48 IU/L (ref 44–121)
BUN/Creatinine Ratio: 19 (ref 10–24)
BUN: 21 mg/dL (ref 8–27)
Bilirubin Total: 0.4 mg/dL (ref 0.0–1.2)
CO2: 27 mmol/L (ref 20–29)
Calcium: 9.8 mg/dL (ref 8.6–10.2)
Chloride: 101 mmol/L (ref 96–106)
Creatinine, Ser: 1.08 mg/dL (ref 0.76–1.27)
Globulin, Total: 2.5 g/dL (ref 1.5–4.5)
Glucose: 92 mg/dL (ref 70–99)
Potassium: 4.2 mmol/L (ref 3.5–5.2)
Sodium: 140 mmol/L (ref 134–144)
Total Protein: 7.4 g/dL (ref 6.0–8.5)
eGFR: 76 mL/min/{1.73_m2} (ref >59)

## 2023-03-07 LAB — CBC
Hematocrit: 42.3 % (ref 37.5–51.0)
Hemoglobin: 14.2 g/dL (ref 13.0–17.7)
MCH: 32.7 pg (ref 26.6–33.0)
MCHC: 33.6 g/dL (ref 31.5–35.7)
MCV: 98 fL — ABNORMAL HIGH (ref 79–97)
Platelets: 180 10*3/uL (ref 150–450)
RBC: 4.34 x10E6/uL (ref 4.14–5.80)
RDW: 12.1 % (ref 11.6–15.4)
WBC: 4.5 10*3/uL (ref 3.4–10.8)

## 2023-03-07 LAB — PHENYTOIN LEVEL, TOTAL: Phenytoin (Dilantin), Serum: 19.6 ug/mL (ref 10.0–20.0)

## 2023-03-21 DIAGNOSIS — Z961 Presence of intraocular lens: Secondary | ICD-10-CM | POA: Diagnosis not present

## 2023-03-21 DIAGNOSIS — H43812 Vitreous degeneration, left eye: Secondary | ICD-10-CM | POA: Diagnosis not present

## 2023-04-05 ENCOUNTER — Other Ambulatory Visit: Payer: Self-pay | Admitting: Adult Health

## 2023-05-07 DIAGNOSIS — E7800 Pure hypercholesterolemia, unspecified: Secondary | ICD-10-CM | POA: Diagnosis not present

## 2023-05-07 DIAGNOSIS — F3342 Major depressive disorder, recurrent, in full remission: Secondary | ICD-10-CM | POA: Diagnosis not present

## 2023-05-07 DIAGNOSIS — G250 Essential tremor: Secondary | ICD-10-CM | POA: Diagnosis not present

## 2023-05-07 DIAGNOSIS — G40909 Epilepsy, unspecified, not intractable, without status epilepticus: Secondary | ICD-10-CM | POA: Diagnosis not present

## 2023-05-07 DIAGNOSIS — H9313 Tinnitus, bilateral: Secondary | ICD-10-CM | POA: Diagnosis not present

## 2023-05-08 DIAGNOSIS — F3289 Other specified depressive episodes: Secondary | ICD-10-CM | POA: Diagnosis not present

## 2023-05-08 DIAGNOSIS — F902 Attention-deficit hyperactivity disorder, combined type: Secondary | ICD-10-CM | POA: Diagnosis not present

## 2023-08-21 DIAGNOSIS — R972 Elevated prostate specific antigen [PSA]: Secondary | ICD-10-CM | POA: Diagnosis not present

## 2023-08-28 DIAGNOSIS — R3912 Poor urinary stream: Secondary | ICD-10-CM | POA: Diagnosis not present

## 2023-08-28 DIAGNOSIS — R972 Elevated prostate specific antigen [PSA]: Secondary | ICD-10-CM | POA: Diagnosis not present

## 2023-08-28 DIAGNOSIS — N401 Enlarged prostate with lower urinary tract symptoms: Secondary | ICD-10-CM | POA: Diagnosis not present

## 2023-09-05 DIAGNOSIS — G40309 Generalized idiopathic epilepsy and epileptic syndromes, not intractable, without status epilepticus: Secondary | ICD-10-CM | POA: Diagnosis not present

## 2023-09-05 DIAGNOSIS — Z8042 Family history of malignant neoplasm of prostate: Secondary | ICD-10-CM | POA: Diagnosis not present

## 2023-09-05 DIAGNOSIS — Z1331 Encounter for screening for depression: Secondary | ICD-10-CM | POA: Diagnosis not present

## 2023-09-05 DIAGNOSIS — E7800 Pure hypercholesterolemia, unspecified: Secondary | ICD-10-CM | POA: Diagnosis not present

## 2023-09-05 DIAGNOSIS — Z0000 Encounter for general adult medical examination without abnormal findings: Secondary | ICD-10-CM | POA: Diagnosis not present

## 2023-09-05 DIAGNOSIS — N400 Enlarged prostate without lower urinary tract symptoms: Secondary | ICD-10-CM | POA: Diagnosis not present

## 2023-09-05 DIAGNOSIS — F3342 Major depressive disorder, recurrent, in full remission: Secondary | ICD-10-CM | POA: Diagnosis not present

## 2023-09-11 DIAGNOSIS — F902 Attention-deficit hyperactivity disorder, combined type: Secondary | ICD-10-CM | POA: Diagnosis not present

## 2023-09-11 DIAGNOSIS — F3289 Other specified depressive episodes: Secondary | ICD-10-CM | POA: Diagnosis not present

## 2023-10-16 DIAGNOSIS — D075 Carcinoma in situ of prostate: Secondary | ICD-10-CM | POA: Diagnosis not present

## 2023-10-16 DIAGNOSIS — R972 Elevated prostate specific antigen [PSA]: Secondary | ICD-10-CM | POA: Diagnosis not present

## 2023-10-22 DIAGNOSIS — R972 Elevated prostate specific antigen [PSA]: Secondary | ICD-10-CM | POA: Diagnosis not present

## 2023-10-22 DIAGNOSIS — N401 Enlarged prostate with lower urinary tract symptoms: Secondary | ICD-10-CM | POA: Diagnosis not present

## 2023-10-22 DIAGNOSIS — R3912 Poor urinary stream: Secondary | ICD-10-CM | POA: Diagnosis not present

## 2023-10-22 DIAGNOSIS — D075 Carcinoma in situ of prostate: Secondary | ICD-10-CM | POA: Diagnosis not present

## 2023-12-26 ENCOUNTER — Telehealth: Payer: Self-pay | Admitting: Neurology

## 2023-12-26 MED ORDER — PHENYTOIN 50 MG PO CHEW: CHEWABLE_TABLET | ORAL | 0 refills | Status: DC

## 2023-12-26 NOTE — Telephone Encounter (Signed)
 Rx refilled per last office visit note. "He is compliant with his Dilantin 350 mg daily, denies any side effect from the medicine. "

## 2023-12-26 NOTE — Telephone Encounter (Signed)
 [place] from [facility] called stating that all the phones in [facility] are not working to day and the pt was wanting to know if his phenytoin (PHENYTOIN INFATABS) 50 MG tablet can be transferred to [facility]. Please advise.

## 2024-01-08 DIAGNOSIS — F3289 Other specified depressive episodes: Secondary | ICD-10-CM | POA: Diagnosis not present

## 2024-01-08 DIAGNOSIS — F902 Attention-deficit hyperactivity disorder, combined type: Secondary | ICD-10-CM | POA: Diagnosis not present

## 2024-03-03 DIAGNOSIS — N400 Enlarged prostate without lower urinary tract symptoms: Secondary | ICD-10-CM | POA: Diagnosis not present

## 2024-03-03 DIAGNOSIS — G40309 Generalized idiopathic epilepsy and epileptic syndromes, not intractable, without status epilepticus: Secondary | ICD-10-CM | POA: Diagnosis not present

## 2024-03-03 DIAGNOSIS — G250 Essential tremor: Secondary | ICD-10-CM | POA: Diagnosis not present

## 2024-03-03 DIAGNOSIS — F3342 Major depressive disorder, recurrent, in full remission: Secondary | ICD-10-CM | POA: Diagnosis not present

## 2024-03-03 DIAGNOSIS — F988 Other specified behavioral and emotional disorders with onset usually occurring in childhood and adolescence: Secondary | ICD-10-CM | POA: Diagnosis not present

## 2024-03-03 DIAGNOSIS — Z23 Encounter for immunization: Secondary | ICD-10-CM | POA: Diagnosis not present

## 2024-03-05 ENCOUNTER — Ambulatory Visit: Payer: PPO | Admitting: Adult Health

## 2024-03-05 VITALS — BP 127/80 | HR 74 | Ht 73.0 in | Wt 201.0 lb

## 2024-03-05 DIAGNOSIS — G40309 Generalized idiopathic epilepsy and epileptic syndromes, not intractable, without status epilepticus: Secondary | ICD-10-CM

## 2024-03-05 DIAGNOSIS — G250 Essential tremor: Secondary | ICD-10-CM

## 2024-03-05 DIAGNOSIS — Z5181 Encounter for therapeutic drug level monitoring: Secondary | ICD-10-CM | POA: Diagnosis not present

## 2024-03-05 MED ORDER — PHENYTOIN SODIUM EXTENDED 100 MG PO CAPS: ORAL_CAPSULE | ORAL | 3 refills | Status: AC

## 2024-03-05 MED ORDER — PHENYTOIN 50 MG PO CHEW: CHEWABLE_TABLET | ORAL | 3 refills | Status: AC

## 2024-03-05 NOTE — Progress Notes (Signed)
 PATIENT: Jeffrey [name] DOB: [birth year]  REASON FOR VISIT: follow up HISTORY FROM: patient  Chief Complaint  Patient presents with   Rm 19    Patient is here alone for follow-up of tremor and seizures. Patient states the Propranolol doesn't seem to be doing anything for his tremor. He stopped it yesterday under the direction of his primary care. His tremor is reported to be about the same. His left hand tremor was so bad that he started writing with his right hand and so far is doing ok. He denies any seizures since his last visit. He feels stable on Dilantin and doesn't have trouble getting it from the pharmacy.      HISTORY OF PRESENT ILLNESS: Today 03/05/24:  Jeffrey [name] is a 68 y.o. male with a history of seizures. Returns today for follow-up.  He remains on Dilantin 350 mg daily.  No seizure like events.  Tolerates the medication well.  No significant change with gait or balance.  Reports that he stopped his propranolol yesterday.  He states that his PCP told him to try taking a break from it for a week and then restart it to see if it is beneficial.  He is not sure that the propranolol ever helped.  Tremor is still primarily located in the left hand.  Reports that he has more trouble with his handwriting   03/06/23:  Patient presents today for follow-up, he is alone.  Last visit was a year ago.  Since then he has not had any seizures or seizure-like activity.  He is compliant with his Dilantin 350 mg daily, denies any side effect from the medicine.  He reports that he was started on propranolol 10 mg daily for his essential tremor by PCP and it seems to control his symptoms.  Otherwise doing well no current concerns.  02/28/22: Jeffrey [name] is a 68 year old male with a history of seizures.  He returns today for follow-up.  He remains on Dilantin 350 mg daily.  Denies any seizures.  He operates a Librarian, academic without difficulty.  The patient feels that his tremor has gotten slightly  worse.  More so in the left hand.  He notices that with his writing and sometimes with eating.  He denies any changes with his gait or balance.  He does not particularly want to be on medication for his tremor.  He feels that it is manageable at this time   02/23/21: Jeffrey [name] is a 68 year old male with a history of seizures.  He returns today for follow-up.He remains on Dilantin 350 mg daily.  He denies any seizure events.  He operates a Librarian, academic without difficulty.  He is able to complete all ADLs independently.  He feels that his tremor is stable.  He continues to notice it with his handwriting and grabbing a cup.  He is feels that the Adderall makes it worse.  He feels that he is slightly off balance.  States that he has not had any falls.  But sometimes he will bump into the wall.  He returns today for evaluation.  11/16/19: Jeffrey [name] is a 68 year old male with a history of seizures.  He returns today for follow-up.  He remains on Dilantin.  He reports that approximately 2 to 3 months ago he was having an intense argument with his wife.  He states in the middle of the argument he blacked out.  He said that he was only out for approximately 10 seconds.  He did fall to the ground.  He did not have any convulsing in the arms or legs.  He did not lose bowel or bladder.  He states in the past his seizure events has included convulsing.  He is not had any additional events since that time.  He continues operating a motor vehicle.  He is able to complete all ADLs independently.  He does note that he has a tremor particularly when he is grabbing a cup, eating and with his handwriting.  He denies any family history of tremor.  He returns today for an evaluation  HISTORY 11/10/18:   Jeffrey [name] is a 68 year old male with a history of seizures.  He returns today for follow-up.  His seizures have been well controlled with Dilantin.  He continues taking Dilantin 350 mg at bedtime.  He denies any seizure events.   Denies any significant changes with his gait or balance.  He reports on occasion he will feel off balance.  Denies any falls.  He operates a Librarian, academic without difficulty.  He is able to complete all ADLs independently.  He returns today for evaluation  REVIEW OF SYSTEMS: Out of a complete 14 system review of symptoms, the patient complains only of the following symptoms, and all other reviewed systems are negative.  See HPI  ALLERGIES: No Known Allergies  HOME MEDICATIONS: Outpatient Medications Prior to Visit  Medication Sig Dispense Refill   amphetamine-dextroamphetamine (ADDERALL) 20 MG tablet Take 10 mg by mouth 3 (three) times daily.      atorvastatin (LIPITOR) 20 MG tablet Take 20 mg at bedtime by mouth.  3   citalopram (CELEXA) 40 MG tablet Take 1 tablet by mouth at bedtime.   1   Multiple Vitamin (MULTIVITAMIN WITH MINERALS) TABS Take 1 tablet by mouth daily.     phenytoin (DILANTIN) 100 MG ER capsule 300 capsules at bedtime 270 capsule 3   phenytoin (PHENYTOIN INFATABS) 50 MG tablet CHEW AND SWALLOW 1 TABLET BY MOUTH EVERY DAY 30 tablet 0   propranolol (INDERAL) 10 MG tablet 1 tablet Orally Once a day, as needed for 30 days     No facility-administered medications prior to visit.    PAST MEDICAL HISTORY: Past Medical History:  Diagnosis Date   ADD (attention deficit disorder)    Benign prostatic hypertrophy    Depression    Seizures (HCC)     PAST SURGICAL HISTORY: Past Surgical History:  Procedure Laterality Date   APPENDECTOMY      FAMILY HISTORY: Family History  Problem Relation Age of Onset   Prostate cancer Father    Seizures Father    Heart Problems Mother    Stroke Sister    Seizures Son     SOCIAL HISTORY: Social History   Socioeconomic History   Marital status: Married    Spouse name: Not on file   Number of children: 2   Years of education: Not on file   Highest education level: Not on file  Occupational History    Employer: [employer]  Tobacco Use   Smoking status: Former    Types: Cigarettes   Smokeless tobacco: Never   Tobacco comments:    Quit 1977  Substance and Sexual Activity   Alcohol use: Yes    Comment: rarely   Drug use: No   Sexual activity: Not on file  Other Topics Concern   Not on file  Social History Narrative   Patient is divorced and works for [employer].    Education- college   Left handed.   Caffeine- None   Social Drivers of Health   Financial Resource Strain: Low Risk  (11/17/2018)   Overall Financial Resource Strain (CARDIA)    Difficulty of Paying Living Expenses: Not hard at all  Food Insecurity: Unknown (11/17/2018)   Hunger Vital Sign    Worried About Running Out of Food in the Last Year: Patient declined    Ran Out of Food in the Last Year: Patient declined  Transportation Needs: Unknown (11/17/2018)   PRAPARE - Administrator, Civil Service (Medical): Patient declined    Lack of Transportation (Non-Medical): Patient declined  Physical Activity: Unknown (11/17/2018)   Exercise Vital Sign    Days of Exercise per Week: Patient declined    Minutes of Exercise per Session: Patient declined  Stress: Unknown (11/17/2018)   [employer] of Occupational Health - Occupational Stress Questionnaire    Feeling of Stress : Patient declined  Social Connections: Unknown (11/17/2018)   Social Connection and Isolation Panel [NHANES]    Frequency of Communication with Friends and Family: Patient declined    Frequency of Social Gatherings with Friends and Family: Patient declined    Attends Religious Services: Patient declined    Database administrator or Organizations: Patient declined    Attends Banker Meetings: Patient declined    Marital Status: Patient declined  Intimate Partner Violence: Unknown (11/17/2018)   Humiliation, Afraid, Rape, and Kick questionnaire    Fear of Current or Ex-Partner: Patient declined    Emotionally Abused:  Patient declined    Physically Abused: Patient declined    Sexually Abused: Patient declined      PHYSICAL EXAM  Vitals:   03/05/24 1036  BP: 127/80  Pulse: 74  Weight: 201 lb (91.2 kg)  Height: 6\' 1"  (1.854 m)     Body mass index is 26.52 kg/m.  Generalized: Well developed, in no acute distress   Neurological examination  Mentation: Alert oriented to time, place, history taking. Follows all commands speech and language fluent Cranial nerve II-XII: Pupils were equal round reactive to light. Extraocular movements were full, visual field were full on confrontational test.  Head turning and shoulder shrug  were normal and symmetric. Motor: The motor testing reveals 5 over 5 strength of all 4 extremities. Good symmetric motor tone is noted throughout.  Sensory: Sensory testing is intact to soft touch on all 4 extremities. No evidence of extinction is noted.  Coordination: Cerebellar testing reveals good finger-nose-finger and heel-to-shin bilaterally.  Mild tremor noticed with finger-nose-finger left greater than right. Positive spiral test with left hand. Gait and station: Patient is able to stand without assistance.  Good stride and arm swing.  2 steps with turns. Reflexes: Deep tendon reflexes are symmetric and normal bilaterally.   DIAGNOSTIC DATA (LABS, IMAGING, TESTING) - I reviewed patient records, labs, notes, testing and imaging myself where available.  Lab Results  Component Value Date   WBC 4.5 03/06/2023   HGB 14.2 03/06/2023   HCT 42.3 03/06/2023   MCV 98 (H) 03/06/2023   PLT 180 03/06/2023      Component Value Date/Time   NA 140 03/06/2023 0945   K 4.2 03/06/2023 0945   CL 101 03/06/2023 0945   CO2 27 03/06/2023 0945   GLUCOSE 92 03/06/2023 0945   GLUCOSE 117 (H) 09/14/2009 1505   BUN 21 03/06/2023 0945   CREATININE 1.08 03/06/2023 0945   CALCIUM 9.8 03/06/2023  0945   PROT 7.4 03/06/2023 0945   ALBUMIN 4.9 03/06/2023 0945   AST 28 03/06/2023 0945    ALT 32 03/06/2023 0945   ALKPHOS 48 03/06/2023 0945   BILITOT 0.4 03/06/2023 0945   GFRNONAA 88 11/16/2019 1103   GFRAA 101 11/16/2019 1103      ASSESSMENT AND PLAN 68 y.o. year old male  has a past medical history of ADD (attention deficit disorder), Benign prostatic hypertrophy, Depression, and Seizures (HCC). here with:  1.  Seizures   --Continue Dilantin 350 mg daily --Blood work today --Advised to let us know if he has any seizure events   2.  Essential tremor  -- At this time patient wishes to stay off of propranolol and see how he responds. --He will let us know in a week if he decides to go back on propranolol. Couled try increasing the dose or switching to ER. Wouldn't try primidone with dilantin as it maybe too sedating.  -- FU in 1 year       Butch Penny, MSN, NP-C 03/05/2024, 10:09 AM [facility] [address] [place], [place] [address] [phone]

## 2024-03-06 ENCOUNTER — Telehealth: Payer: Self-pay | Admitting: *Deleted

## 2024-03-06 DIAGNOSIS — G40309 Generalized idiopathic epilepsy and epileptic syndromes, not intractable, without status epilepticus: Secondary | ICD-10-CM

## 2024-03-06 LAB — CBC WITH DIFFERENTIAL/PLATELET
Basophils Absolute: 0 10*3/uL (ref 0.0–0.2)
Basos: 0 %
EOS (ABSOLUTE): 0.1 10*3/uL (ref 0.0–0.4)
Eos: 3 %
Hematocrit: 41.3 % (ref 37.5–51.0)
Hemoglobin: 14 g/dL (ref 13.0–17.7)
Immature Grans (Abs): 0 10*3/uL (ref 0.0–0.1)
Immature Granulocytes: 0 %
Lymphocytes Absolute: 1.4 10*3/uL (ref 0.7–3.1)
Lymphs: 32 %
MCH: 33.7 pg — ABNORMAL HIGH (ref 26.6–33.0)
MCHC: 33.9 g/dL (ref 31.5–35.7)
MCV: 100 fL — ABNORMAL HIGH (ref 79–97)
Monocytes Absolute: 0.4 10*3/uL (ref 0.1–0.9)
Monocytes: 8 %
Neutrophils Absolute: 2.6 10*3/uL (ref 1.4–7.0)
Neutrophils: 57 %
Platelets: 180 10*3/uL (ref 150–450)
RBC: 4.15 x10E6/uL (ref 4.14–5.80)
RDW: 12.3 % (ref 11.6–15.4)
WBC: 4.6 10*3/uL (ref 3.4–10.8)

## 2024-03-06 LAB — COMPREHENSIVE METABOLIC PANEL
ALT: 44 IU/L (ref 0–44)
AST: 34 IU/L (ref 0–40)
Albumin: 4.7 g/dL (ref 3.9–4.9)
Alkaline Phosphatase: 52 IU/L (ref 44–121)
BUN/Creatinine Ratio: 24 (ref 10–24)
BUN: 21 mg/dL (ref 8–27)
Bilirubin Total: 0.3 mg/dL (ref 0.0–1.2)
CO2: 23 mmol/L (ref 20–29)
Calcium: 9.5 mg/dL (ref 8.6–10.2)
Chloride: 102 mmol/L (ref 96–106)
Creatinine, Ser: 0.89 mg/dL (ref 0.76–1.27)
Globulin, Total: 2.6 g/dL (ref 1.5–4.5)
Glucose: 83 mg/dL (ref 70–99)
Potassium: 4.5 mmol/L (ref 3.5–5.2)
Sodium: 140 mmol/L (ref 134–144)
Total Protein: 7.3 g/dL (ref 6.0–8.5)
eGFR: 95 mL/min/{1.73_m2} (ref >59)

## 2024-03-06 LAB — PHENYTOIN LEVEL, TOTAL: Phenytoin (Dilantin), Serum: 25 ug/mL (ref 10.0–20.0)

## 2024-03-06 NOTE — Telephone Encounter (Signed)
-----   Message from Butch Penny sent at 03/06/2024  9:56 AM EDT ----- Elvina Sidle can you call the patient his Dilantin level was elevated.  Not sure that this was a trough level or if he had taken medication that morning.  We need to recheck levels but they should be a trough level.

## 2024-03-06 NOTE — Telephone Encounter (Signed)
 Called and LVM asking pt call office back Monday.

## 2024-03-10 NOTE — Telephone Encounter (Signed)
 Called pt again since no return call yet. I relayed results per MM,NP. He confirmed he took medication before lab drawn and apologized as he knows to hold typically when he gets labs drawn. He will come tomorrow morning at 8:45a to have trough level drawn. Knows to hold medicine until after lab draw. Future lab order placed.

## 2024-03-10 NOTE — Addendum Note (Signed)
 Addended by: Arther Abbott on: 03/10/2024 11:46 AM   Modules accepted: Orders

## 2024-03-11 ENCOUNTER — Other Ambulatory Visit (INDEPENDENT_AMBULATORY_CARE_PROVIDER_SITE_OTHER): Payer: Self-pay

## 2024-03-11 DIAGNOSIS — Z0289 Encounter for other administrative examinations: Secondary | ICD-10-CM

## 2024-03-11 DIAGNOSIS — G40309 Generalized idiopathic epilepsy and epileptic syndromes, not intractable, without status epilepticus: Secondary | ICD-10-CM | POA: Diagnosis not present

## 2024-03-12 ENCOUNTER — Telehealth: Payer: Self-pay | Admitting: Adult Health

## 2024-03-12 DIAGNOSIS — Z5181 Encounter for therapeutic drug level monitoring: Secondary | ICD-10-CM

## 2024-03-12 DIAGNOSIS — G40309 Generalized idiopathic epilepsy and epileptic syndromes, not intractable, without status epilepticus: Secondary | ICD-10-CM

## 2024-03-12 LAB — PHENYTOIN LEVEL, TOTAL: Phenytoin (Dilantin), Serum: 24.7 ug/mL (ref 10.0–20.0)

## 2024-03-12 NOTE — Telephone Encounter (Signed)
 I called the patient advise that his Dilantin level was still elevated.  I discussed with Dr. Teresa Coombs and he recommended eliminating the 50 mg tablet.  I discussed this with the patient.  He states that he has not had any dizziness, changes in gait, speech changes.  His last seizure was in 2006 however he states in 2016 he was having episodes of tinnitus and could not understand what people were saying.  This is when Dr. Anne Hahn added on 50 mg tablet and these episodes stopped.  The patient is hesitant to reduce his medication.  He is asking to stay on the current dose.   I advised that I would discuss with Dr. Teresa Coombs if he had any further recommendations I would give him a call back.  We can plan to recheck levels in the next 2 to 4 weeks.

## 2024-03-13 NOTE — Telephone Encounter (Signed)
 No side effect on the medication, please keep him on the same dose.

## 2024-03-17 NOTE — Addendum Note (Signed)
 Addended by: Bertram Savin on: 03/17/2024 03:05 PM   Modules accepted: Orders

## 2024-03-17 NOTE — Telephone Encounter (Signed)
 Please let patient know that Dr. Teresa Coombs was okay with him staying on the current dose.  Lets recheck Dilantin level in 2 to 3 weeks

## 2024-03-17 NOTE — Telephone Encounter (Signed)
 I called pt and let him know that Aundra Millet NP had spoken with Dr. Teresa Coombs and he was okay with him staying on the current dose.  Recommended recheck Dilantin level in 2 to 3 weeks .  I relayed to do trough level prior to his first dose in the am.  He will bring medication with him and have lab drawn prior to taking it.  He verbalized understanding of plan.  No lab appt needed.  0800 is [facility] in office opens.

## 2024-03-25 ENCOUNTER — Other Ambulatory Visit (INDEPENDENT_AMBULATORY_CARE_PROVIDER_SITE_OTHER): Payer: Self-pay

## 2024-03-25 DIAGNOSIS — G40309 Generalized idiopathic epilepsy and epileptic syndromes, not intractable, without status epilepticus: Secondary | ICD-10-CM

## 2024-03-25 DIAGNOSIS — Z5181 Encounter for therapeutic drug level monitoring: Secondary | ICD-10-CM

## 2024-03-25 DIAGNOSIS — Z0289 Encounter for other administrative examinations: Secondary | ICD-10-CM

## 2024-03-26 ENCOUNTER — Encounter: Payer: Self-pay | Admitting: Adult Health

## 2024-03-26 DIAGNOSIS — H0288B Meibomian gland dysfunction left eye, upper and lower eyelids: Secondary | ICD-10-CM | POA: Diagnosis not present

## 2024-03-26 DIAGNOSIS — Z961 Presence of intraocular lens: Secondary | ICD-10-CM | POA: Diagnosis not present

## 2024-03-26 DIAGNOSIS — H43812 Vitreous degeneration, left eye: Secondary | ICD-10-CM | POA: Diagnosis not present

## 2024-03-26 DIAGNOSIS — H0288A Meibomian gland dysfunction right eye, upper and lower eyelids: Secondary | ICD-10-CM | POA: Diagnosis not present

## 2024-03-26 LAB — PHENYTOIN LEVEL, TOTAL: Phenytoin (Dilantin), Serum: 19.3 ug/mL (ref 10.0–20.0)

## 2024-05-13 DIAGNOSIS — R3912 Poor urinary stream: Secondary | ICD-10-CM | POA: Diagnosis not present

## 2024-05-13 DIAGNOSIS — N401 Enlarged prostate with lower urinary tract symptoms: Secondary | ICD-10-CM | POA: Diagnosis not present

## 2024-05-13 DIAGNOSIS — R972 Elevated prostate specific antigen [PSA]: Secondary | ICD-10-CM | POA: Diagnosis not present

## 2024-05-20 DIAGNOSIS — F902 Attention-deficit hyperactivity disorder, combined type: Secondary | ICD-10-CM | POA: Diagnosis not present

## 2024-05-20 DIAGNOSIS — F331 Major depressive disorder, recurrent, moderate: Secondary | ICD-10-CM | POA: Diagnosis not present

## 2024-06-08 DIAGNOSIS — R310 Gross hematuria: Secondary | ICD-10-CM | POA: Diagnosis not present

## 2024-06-08 DIAGNOSIS — N410 Acute prostatitis: Secondary | ICD-10-CM | POA: Diagnosis not present

## 2024-06-30 DIAGNOSIS — N410 Acute prostatitis: Secondary | ICD-10-CM | POA: Diagnosis not present

## 2024-09-08 DIAGNOSIS — G252 Other specified forms of tremor: Secondary | ICD-10-CM | POA: Diagnosis not present

## 2024-09-08 DIAGNOSIS — F3342 Major depressive disorder, recurrent, in full remission: Secondary | ICD-10-CM | POA: Diagnosis not present

## 2024-09-08 DIAGNOSIS — E7800 Pure hypercholesterolemia, unspecified: Secondary | ICD-10-CM | POA: Diagnosis not present

## 2024-09-08 DIAGNOSIS — H9313 Tinnitus, bilateral: Secondary | ICD-10-CM | POA: Diagnosis not present

## 2024-09-08 DIAGNOSIS — N400 Enlarged prostate without lower urinary tract symptoms: Secondary | ICD-10-CM | POA: Diagnosis not present

## 2024-09-08 DIAGNOSIS — K5730 Diverticulosis of large intestine without perforation or abscess without bleeding: Secondary | ICD-10-CM | POA: Diagnosis not present

## 2024-09-08 DIAGNOSIS — F988 Other specified behavioral and emotional disorders with onset usually occurring in childhood and adolescence: Secondary | ICD-10-CM | POA: Diagnosis not present

## 2024-09-08 DIAGNOSIS — G40309 Generalized idiopathic epilepsy and epileptic syndromes, not intractable, without status epilepticus: Secondary | ICD-10-CM | POA: Diagnosis not present

## 2024-09-08 DIAGNOSIS — Z1331 Encounter for screening for depression: Secondary | ICD-10-CM | POA: Diagnosis not present

## 2024-09-08 DIAGNOSIS — H9190 Unspecified hearing loss, unspecified ear: Secondary | ICD-10-CM | POA: Diagnosis not present

## 2024-09-08 DIAGNOSIS — Z0000 Encounter for general adult medical examination without abnormal findings: Secondary | ICD-10-CM | POA: Diagnosis not present

## 2024-09-08 DIAGNOSIS — Z23 Encounter for immunization: Secondary | ICD-10-CM | POA: Diagnosis not present

## 2024-09-16 DIAGNOSIS — F902 Attention-deficit hyperactivity disorder, combined type: Secondary | ICD-10-CM | POA: Diagnosis not present

## 2024-09-16 DIAGNOSIS — F331 Major depressive disorder, recurrent, moderate: Secondary | ICD-10-CM | POA: Diagnosis not present

## 2024-10-20 DIAGNOSIS — R399 Unspecified symptoms and signs involving the genitourinary system: Secondary | ICD-10-CM | POA: Diagnosis not present

## 2024-10-20 DIAGNOSIS — R310 Gross hematuria: Secondary | ICD-10-CM | POA: Diagnosis not present

## 2024-10-20 DIAGNOSIS — N411 Chronic prostatitis: Secondary | ICD-10-CM | POA: Diagnosis not present

## 2024-10-20 DIAGNOSIS — R300 Dysuria: Secondary | ICD-10-CM | POA: Diagnosis not present

## 2024-10-20 DIAGNOSIS — R3915 Urgency of urination: Secondary | ICD-10-CM | POA: Diagnosis not present

## 2024-11-03 DIAGNOSIS — R310 Gross hematuria: Secondary | ICD-10-CM | POA: Diagnosis not present

## 2024-11-03 DIAGNOSIS — R972 Elevated prostate specific antigen [PSA]: Secondary | ICD-10-CM | POA: Diagnosis not present

## 2024-11-11 DIAGNOSIS — N401 Enlarged prostate with lower urinary tract symptoms: Secondary | ICD-10-CM | POA: Diagnosis not present

## 2024-11-11 DIAGNOSIS — R82998 Other abnormal findings in urine: Secondary | ICD-10-CM | POA: Diagnosis not present

## 2024-11-11 DIAGNOSIS — R310 Gross hematuria: Secondary | ICD-10-CM | POA: Diagnosis not present

## 2024-11-11 DIAGNOSIS — R3914 Feeling of incomplete bladder emptying: Secondary | ICD-10-CM | POA: Diagnosis not present

## 2024-11-11 DIAGNOSIS — R350 Frequency of micturition: Secondary | ICD-10-CM | POA: Diagnosis not present

## 2024-11-11 DIAGNOSIS — R351 Nocturia: Secondary | ICD-10-CM | POA: Diagnosis not present

## 2024-12-11 ENCOUNTER — Emergency Department (HOSPITAL_BASED_OUTPATIENT_CLINIC_OR_DEPARTMENT_OTHER)

## 2024-12-11 ENCOUNTER — Other Ambulatory Visit: Payer: Self-pay | Admitting: Urology

## 2024-12-11 DIAGNOSIS — N3001 Acute cystitis with hematuria: Secondary | ICD-10-CM | POA: Diagnosis present

## 2024-12-11 DIAGNOSIS — N401 Enlarged prostate with lower urinary tract symptoms: Secondary | ICD-10-CM

## 2024-12-11 DIAGNOSIS — N320 Bladder-neck obstruction: Secondary | ICD-10-CM | POA: Insufficient documentation

## 2024-12-11 NOTE — ED Triage Notes (Addendum)
 Suspects UTI. Difficulty urinating and burning. 4-5 days. Endorses some left flank pain.[facility]. BPH HX.

## 2024-12-12 ENCOUNTER — Emergency Department (HOSPITAL_BASED_OUTPATIENT_CLINIC_OR_DEPARTMENT_OTHER)
Admission: EM | Admit: 2024-12-12 | Discharge: 2024-12-12 | Disposition: A | Attending: Emergency Medicine | Admitting: Emergency Medicine

## 2024-12-12 DIAGNOSIS — N320 Bladder-neck obstruction: Secondary | ICD-10-CM

## 2024-12-12 DIAGNOSIS — N3001 Acute cystitis with hematuria: Secondary | ICD-10-CM

## 2024-12-12 LAB — CBC WITH DIFFERENTIAL/PLATELET
Abs Immature Granulocytes: 0.01 K/uL (ref 0.00–0.07)
Basophils Absolute: 0 K/uL (ref 0.0–0.1)
Basophils Relative: 0 %
Eosinophils Absolute: 0.2 K/uL (ref 0.0–0.5)
Eosinophils Relative: 3 %
HCT: 35.7 % — ABNORMAL LOW (ref 39.0–52.0)
Hemoglobin: 12.2 g/dL — ABNORMAL LOW (ref 13.0–17.0)
Immature Granulocytes: 0 %
Lymphocytes Relative: 28 %
Lymphs Abs: 1.7 K/uL (ref 0.7–4.0)
MCH: 33.4 pg (ref 26.0–34.0)
MCHC: 34.2 g/dL (ref 30.0–36.0)
MCV: 97.8 fL (ref 80.0–100.0)
Monocytes Absolute: 0.5 K/uL (ref 0.1–1.0)
Monocytes Relative: 8 %
Neutro Abs: 3.7 K/uL (ref 1.7–7.7)
Neutrophils Relative %: 61 %
Platelets: 177 K/uL (ref 150–400)
RBC: 3.65 MIL/uL — ABNORMAL LOW (ref 4.22–5.81)
RDW: 12.8 % (ref 11.5–15.5)
WBC: 6 K/uL (ref 4.0–10.5)
nRBC: 0 % (ref 0.0–0.2)

## 2024-12-12 LAB — URINALYSIS, ROUTINE W REFLEX MICROSCOPIC
Bilirubin Urine: NEGATIVE
Glucose, UA: 100 mg/dL — AB
Ketones, ur: NEGATIVE mg/dL
Nitrite: NEGATIVE
Protein, ur: 100 mg/dL — AB
Specific Gravity, Urine: 1.023 (ref 1.005–1.030)
WBC, UA: >50 WBC/hpf (ref 0–5)
pH: 7.5 (ref 5.0–8.0)

## 2024-12-12 LAB — BASIC METABOLIC PANEL WITH GFR
Anion gap: 10 (ref 5–15)
BUN: 23 mg/dL (ref 8–23)
CO2: 26 mmol/L (ref 22–32)
Calcium: 9.2 mg/dL (ref 8.9–10.3)
Chloride: 106 mmol/L (ref 98–111)
Creatinine, Ser: 0.89 mg/dL (ref 0.61–1.24)
GFR, Estimated: >60 mL/min
Glucose, Bld: 105 mg/dL — ABNORMAL HIGH (ref 70–99)
Potassium: 3.8 mmol/L (ref 3.5–5.1)
Sodium: 142 mmol/L (ref 135–145)

## 2024-12-12 MED ORDER — CEFPODOXIME PROXETIL 200 MG PO TABS: 200.0000 mg | ORAL_TABLET | Freq: Two times a day (BID) | ORAL | 0 refills | Status: AC

## 2024-12-12 MED ORDER — DIAZEPAM 5 MG/ML IJ SOLN
2.5000 mg | Freq: Once | INTRAMUSCULAR | Status: AC
  Administered 2024-12-12: 2.5 mg via INTRAVENOUS
  Filled 2024-12-12: qty 2

## 2024-12-12 MED ORDER — MORPHINE SULFATE (PF) 4 MG/ML IV SOLN
4.0000 mg | Freq: Once | INTRAVENOUS | Status: AC
  Administered 2024-12-12: 4 mg via INTRAVENOUS
  Filled 2024-12-12: qty 1

## 2024-12-12 MED ORDER — LIDOCAINE HCL URETHRAL/MUCOSAL 2 % EX GEL: 1.0000 | Freq: Once | CUTANEOUS | Status: DC

## 2024-12-12 MED ORDER — SODIUM CHLORIDE 0.9 % IV SOLN
1.0000 g | Freq: Once | INTRAVENOUS | Status: AC
  Administered 2024-12-12: 1 g via INTRAVENOUS
  Filled 2024-12-12: qty 10

## 2024-12-12 NOTE — ED Notes (Signed)
 ED provider made aware that patient is requesting pain meds.

## 2024-12-12 NOTE — ED Provider Notes (Signed)
 " [place] EMERGENCY DEPARTMENT AT [facility] Provider Note  CSN: [id number] Arrival date & time: 12/11/24 2154  Chief Complaint(s) No chief complaint on file.  HPI Jeffrey [name] is a 68 y.o. male with history of BPH who is here today for burning with urination, left-sided flank pain.  He states has been ongoing for the last 4 to 5 days.  He denies fever or chills.   Past Medical History Past Medical History:  Diagnosis Date   ADD (attention deficit disorder)    Benign prostatic hypertrophy    Depression    Seizures (HCC)    Patient Active Problem List   Diagnosis Date Noted   Hematuria 11/19/2018   Gross hematuria 11/17/2018   Generalized convulsive epilepsy (HCC) 10/26/2013   Encounter for therapeutic drug monitoring 10/26/2013   Home Medication(s) Prior to Admission medications  Medication Sig Start Date End Date Taking? Authorizing Provider  cefpodoxime  (VANTIN ) 200 MG tablet Take 1 tablet (200 mg total) by mouth 2 (two) times daily for 7 days. 12/12/24 12/19/24 Yes Mannie Fairy T, DO  alfuzosin (UROXATRAL) 10 MG 24 hr tablet Take 10 mg by mouth daily.    [provider]  amphetamine -dextroamphetamine  (ADDERALL) 20 MG tablet Take 10 mg by mouth 3 (three) times daily.     [provider]  atorvastatin (LIPITOR) 20 MG tablet Take 20 mg at bedtime by mouth. 10/23/17   [provider]  citalopram  (CELEXA ) 40 MG tablet Take 1 tablet by mouth at bedtime.  08/19/15   [provider]  Multiple Vitamin (MULTIVITAMIN WITH MINERALS) TABS Take 1 tablet by mouth daily.    [provider]  phenytoin  (DILANTIN ) 100 MG ER capsule 100 mg in the morning and 200 mg at bedtime 03/05/24   Millikan, Megan, NP  phenytoin  (PHENYTOIN  INFATABS) 50 MG tablet CHEW AND SWALLOW 1 TABLET BY MOUTH EVERY DAY 03/05/24   Millikan, Megan, NP  propranolol (INDERAL) 10 MG tablet 1 tablet Orally Once a day, as needed for 30 days Patient not taking:  Reported on 03/05/2024 02/21/23   [provider]                                                                                                                                    Past Surgical History Past Surgical History:  Procedure Laterality Date   APPENDECTOMY     Family History Family History  Problem Relation Age of Onset   Prostate cancer Father    Seizures Father    Heart Problems Mother    Stroke Sister    Seizures Son     Social History Social History[1] Allergies Patient has no known allergies.  Review of Systems Review of Systems  Physical Exam Vital Signs  I have reviewed the triage vital signs BP 128/76 (BP Location: Right Arm)   Pulse 77   Temp 98 F (36.7 C) (Oral)   Resp 19  SpO2 95%   Physical Exam Vitals and nursing note reviewed.  Constitutional:      Appearance: He is not toxic-appearing.  HENT:     Head: Normocephalic.  Cardiovascular:     Rate and Rhythm: Normal rate.  Abdominal:     General: Abdomen is flat.     Palpations: Abdomen is soft.     Tenderness: There is no guarding.  Musculoskeletal:     Cervical back: Normal range of motion.  Skin:    General: Skin is warm.  Neurological:     Mental Status: He is alert.     ED Results and Treatments Labs (all labs ordered are listed, but only abnormal results are displayed) Labs Reviewed  URINALYSIS, ROUTINE W REFLEX MICROSCOPIC - Abnormal; Notable for the following components:      Result Value   APPearance CLOUDY (*)    Glucose, UA 100 (*)    Hgb urine dipstick SMALL (*)    Protein, ur 100 (*)    Leukocytes,Ua LARGE (*)    Bacteria, UA RARE (*)    All other components within normal limits  BASIC METABOLIC PANEL WITH GFR - Abnormal; Notable for the following components:   Glucose, Bld 105 (*)    All other components within normal limits  CBC WITH DIFFERENTIAL/PLATELET - Abnormal; Notable for the following components:   RBC 3.65 (*)    Hemoglobin 12.2 (*)    HCT  35.7 (*)    All other components within normal limits                                                                                                                          Radiology CT ABDOMEN PELVIS WO CONTRAST Result Date: 12/12/2024 CLINICAL DATA:  Abdominal/flank pain, stone suspected EXAM: CT ABDOMEN AND PELVIS WITHOUT CONTRAST TECHNIQUE: Multidetector CT imaging of the abdomen and pelvis was performed following the standard protocol without IV contrast. RADIATION DOSE REDUCTION: This exam was performed according to the departmental dose-optimization program which includes automated exposure control, adjustment of the mA and/or kV according to patient size and/or use of iterative reconstruction technique. COMPARISON:  CT 11/03/2024 FINDINGS: Lower chest: Clear lung bases. Hepatobiliary: Number is low-density lesions in the liver are stable from prior exam, likely cyst. Gallstones within decompressed gallbladder. No pericholecystic inflammation. No biliary dilatation. Pancreas: No ductal dilatation or inflammation. Spleen: Normal in size without focal abnormality. Adrenals/Urinary Tract: Normal adrenal glands. No renal calculi. Symmetric bilateral perinephric stranding is unchanged from prior exam. The tiny low-density left renal lesions on prior exam are not seen in the absence of IV contrast. The bladder is moderately distended. Bladder wall appears trabeculated with wall thickening posteriorly. No bladder stone. Stomach/Bowel: No bowel obstruction or inflammation. Moderate stool primarily in the ascending colon. The appendix is not visualized, appendectomy per history. Vascular/Lymphatic: Normal caliber abdominal aorta. Aorto bi-iliac atherosclerosis. Haziness in the left small bowel mesentery with multiple small lymph nodes, stable from prior exam. No suspicious  lymphadenopathy. Reproductive: Enlarged prostate gland measuring 6.2 x 6.6 x 6.7 cm with clips or urolith. Prostate causes mass effect on  the bladder base. Other: No free air or ascites.  No abdominal wall hernia. Musculoskeletal: There are no acute or suspicious osseous abnormalities. Mild degenerative change in the spine. IMPRESSION: 1. No renal stones or obstructive uropathy. 2. Enlarged prostate gland with mass effect on the bladder base. Bladder wall appears trabeculated with wall thickening posteriorly, likely chronic bladder outlet obstruction. 3. Cholelithiasis without gallbladder inflammation. Aortic Atherosclerosis (ICD10-I70.0). Electronically Signed   By: Andrea Gasman M.D.   On: 12/12/2024 00:12    Pertinent labs & imaging results that were available during my care of the patient were reviewed by me and considered in my medical decision making (see MDM for details).  Medications Ordered in ED Medications  lidocaine  (XYLOCAINE ) 2 % jelly 1 Application (has no administration in time range)  morphine  (PF) 4 MG/ML injection 4 mg (4 mg Intravenous Given 12/12/24 0209)  cefTRIAXone  (ROCEPHIN ) 1 g in sodium chloride  0.9 % 100 mL IVPB (0 g Intravenous Stopped 12/12/24 0243)  diazepam  (VALIUM ) injection 2.5 mg (2.5 mg Intravenous Given 12/12/24 0256)                                                                                                                                     Procedures Procedures  (including critical care time)  Medical Decision Making / ED Course   This patient presents to the ED for concern of dysuria, this involves an extensive number of treatment options, and is a complaint that carries with it a high risk of complications and morbidity.  The differential diagnosis includes cystitis, urinary retention, pyelonephritis.  MDM: Patient's urine is consistent with cystitis.  CT imaging shows an enlarged prostate, no hydronephrosis.  Will check a CBC and a BMP on the patient.  Will provide him with some Rocephin  here in the ED.  Reassessment 3:20 AM-patient is having difficulty with voiding, I did  have concern for urinary retention.  Had nursing staff placed Foley catheter, 800 cc output.  Will discharge patient with prescription for cefpodoxime .  He will follow-up with his urologist.  Additional history obtained: -Additional history obtained from wife at bedside -External records from outside source obtained and reviewed including: Chart review including previous notes, labs, imaging, consultation notes   Lab Tests: -I ordered, reviewed, and interpreted labs.   The pertinent results include:   Labs Reviewed  URINALYSIS, ROUTINE W REFLEX MICROSCOPIC - Abnormal; Notable for the following components:      Result Value   APPearance CLOUDY (*)    Glucose, UA 100 (*)    Hgb urine dipstick SMALL (*)    Protein, ur 100 (*)    Leukocytes,Ua LARGE (*)    Bacteria, UA RARE (*)    All other components within normal limits  BASIC METABOLIC PANEL WITH GFR - Abnormal; Notable  for the following components:   Glucose, Bld 105 (*)    All other components within normal limits  CBC WITH DIFFERENTIAL/PLATELET - Abnormal; Notable for the following components:   RBC 3.65 (*)    Hemoglobin 12.2 (*)    HCT 35.7 (*)    All other components within normal limits      Imaging Studies ordered: I ordered imaging studies including CT abdomen pelvis I independently visualized and interpreted imaging. I agree with the radiologist interpretation   Medicines ordered and prescription drug management: Meds ordered this encounter  Medications   morphine  (PF) 4 MG/ML injection 4 mg   cefTRIAXone  (ROCEPHIN ) 1 g in sodium chloride  0.9 % 100 mL IVPB    Antibiotic Indication::   UTI   diazepam  (VALIUM ) injection 2.5 mg   lidocaine  (XYLOCAINE ) 2 % jelly 1 Application   cefpodoxime  (VANTIN ) 200 MG tablet    Sig: Take 1 tablet (200 mg total) by mouth 2 (two) times daily for 7 days.    Dispense:  14 tablet    Refill:  0    -I have reviewed the patients home medicines and have made adjustments as  needed  Cardiac Monitoring: The patient was maintained on a cardiac monitor.  I personally viewed and interpreted the cardiac monitored which showed an underlying rhythm of: Normal sinus rhythm  Social Determinants of Health:  Factors impacting patients care include: Multiple medical comorbidities including BPH   Reevaluation: After the interventions noted above, I reevaluated the patient and found that they have :improved  Co morbidities that complicate the patient evaluation  Past Medical History:  Diagnosis Date   ADD (attention deficit disorder)    Benign prostatic hypertrophy    Depression    Seizures (HCC)       Dispostion: I considered admission for this patient, however he is appropriate for outpatient management.     Final Clinical Impression(s) / ED Diagnoses Final diagnoses:  Acute cystitis with hematuria  Bladder outlet obstruction     @PCDICTATION @     [1]  Social History Tobacco Use   Smoking status: Former    Types: Cigarettes   Smokeless tobacco: Never   Tobacco comments:    Quit 1977  Substance Use Topics   Alcohol use: Yes    Comment: rarely   Drug use: No     Mannie Pac T, DO 12/12/24 0330  "

## 2024-12-12 NOTE — Discharge Instructions (Addendum)
 Please call your urologist on Monday for follow-up appointment.  I have sent your prescription for an antibiotic called cefpodoxime .  Take it once in the morning and once in the evening for the next 1 week.  You may begin taking it on the morning of 12/20.  Return to the emergency room for worsening pain, fever or confusion.

## 2024-12-24 ENCOUNTER — Emergency Department (HOSPITAL_BASED_OUTPATIENT_CLINIC_OR_DEPARTMENT_OTHER): Admission: EM | Admit: 2024-12-24 | Discharge: 2024-12-24 | Disposition: A | Discharge status: Home / Self Care

## 2024-12-24 ENCOUNTER — Other Ambulatory Visit: Payer: Self-pay

## 2024-12-24 ENCOUNTER — Encounter (HOSPITAL_BASED_OUTPATIENT_CLINIC_OR_DEPARTMENT_OTHER): Payer: Self-pay

## 2024-12-24 DIAGNOSIS — R339 Retention of urine, unspecified: Secondary | ICD-10-CM | POA: Diagnosis present

## 2024-12-24 DIAGNOSIS — R300 Dysuria: Secondary | ICD-10-CM | POA: Insufficient documentation

## 2024-12-24 LAB — URINALYSIS, ROUTINE W REFLEX MICROSCOPIC
Bilirubin Urine: NEGATIVE
Glucose, UA: NEGATIVE mg/dL
Ketones, ur: NEGATIVE mg/dL
Nitrite: NEGATIVE
Protein, ur: 30 mg/dL — AB
Specific Gravity, Urine: 1.02 (ref 1.005–1.030)
pH: 6 (ref 5.0–8.0)

## 2024-12-24 LAB — URINALYSIS, MICROSCOPIC (REFLEX)

## 2024-12-24 MED ORDER — CIPROFLOXACIN HCL 500 MG PO TABS: 500.0000 mg | ORAL_TABLET | Freq: Two times a day (BID) | ORAL | 0 refills | Status: AC

## 2024-12-24 MED ORDER — OXYCODONE-ACETAMINOPHEN 5-325 MG PO TABS
1.0000 | ORAL_TABLET | ORAL | Status: DC | PRN
  Administered 2024-12-24: 1 via ORAL
  Filled 2024-12-24: qty 1

## 2024-12-24 MED ORDER — LIDOCAINE HCL URETHRAL/MUCOSAL 2 % EX GEL
1.0000 | Freq: Once | CUTANEOUS | Status: AC
  Administered 2024-12-24: 1 via URETHRAL
  Filled 2024-12-24: qty 11

## 2024-12-24 NOTE — ED Notes (Signed)
 Discharge paperwork reviewed entirely with patient, including follow up care. Pain was under control. The patient received instruction and coaching on their prescriptions, and all follow-up questions were answered.  Pt verbalized understanding as well as all parties involved. No questions or concerns voiced at the time of discharge. No acute distress noted. Pt was encouraged to stay adequately hydrated and eat a healthy diet.   Pt ambulated out to PVA without incident or assistance.  Pt advised they will notify their PCP immediately. and Pt advised they will seek followup care with a specialist and followup with their PCP.   The pt was instructed to set up and/or review MyChart for their results; and was informed their Providers all have access to the information as well.   Foley catheter converted to leg bag.

## 2024-12-24 NOTE — ED Triage Notes (Signed)
 Patient seen here recently for urinary troubles. Was given a catheter and he saw urology. They took it out two days ago. He said since then he has had a lot of decreased urination and just dribbling. He also has some burning.

## 2024-12-24 NOTE — Discharge Instructions (Signed)
 As we discussed, we are starting you on an antibiotic for evidence of a urinary tract infection. A culture has been sent and will result in about 2 days. This should be reviewed with Dr. Selma and determination made at that time whether there is a need to continue the antibiotic.   Return to the ED with any new or concerning symptoms at any time.

## 2024-12-24 NOTE — ED Notes (Signed)
 Called lab to add urine culture.

## 2024-12-24 NOTE — ED Provider Notes (Addendum)
 " [place] EMERGENCY DEPARTMENT AT [facility] Provider Note   CSN: [id number] Arrival date & time: 12/24/24  1620     Patient presents with: Dysuria and Urinary Retention   Jeffrey [name] is a 69 y.o. male.   Patient with history of enlarged prostate, chronic bladder outlet obstruction, seizure disorder on phenytoin  --presents to the emergency department for evaluation of recurrent urinary retention.  Patient was seen in the emergency department on 12/19.  He was diagnosed with UTI at that time as well as urinary retention.  Catheter was placed.  Patient followed up with urology 2 days ago.  He had voiding trial in past and was doing well up until yesterday when stream decreased and now just has some dribbling.  He has a sensation of needing to urinate.  He has suprapubic pain and spasms.  Suspect recurrent urinary retention.  Patient with no fevers.  Patient completed 7 days of cefpodoxime  for UTI.  Does not appear that a culture was sent.  Patient reports plan for angiogram procedure of blood vessels in the pelvis tomorrow, as he is supposed to have prostate artery embolization for enlarged prostate done in [place] in the near future.       Prior to Admission medications  Medication Sig Start Date End Date Taking? Authorizing Provider  alfuzosin (UROXATRAL) 10 MG 24 hr tablet Take 10 mg by mouth daily.    [provider]  amphetamine -dextroamphetamine  (ADDERALL) 20 MG tablet Take 10 mg by mouth 3 (three) times daily.     [provider]  atorvastatin (LIPITOR) 20 MG tablet Take 20 mg at bedtime by mouth. 10/23/17   [provider]  citalopram  (CELEXA ) 40 MG tablet Take 1 tablet by mouth at bedtime.  08/19/15   [provider]  Multiple Vitamin (MULTIVITAMIN WITH MINERALS) TABS Take 1 tablet by mouth daily.    [provider]  phenytoin  (DILANTIN ) 100 MG ER capsule 100 mg in the morning and 200 mg at bedtime 03/05/24   Millikan,  Megan, NP  phenytoin  (PHENYTOIN  INFATABS) 50 MG tablet CHEW AND SWALLOW 1 TABLET BY MOUTH EVERY DAY 03/05/24   Millikan, Megan, NP  propranolol (INDERAL) 10 MG tablet 1 tablet Orally Once a day, as needed for 30 days Patient not taking: Reported on 03/05/2024 02/21/23   [provider]    Allergies: Patient has no known allergies.    Review of Systems  Updated Vital Signs BP (!) 155/88 (BP Location: Right Arm)   Pulse 93   Temp 98.2 F (36.8 C)   Resp 20   SpO2 97%   Physical Exam Vitals and nursing note reviewed.  Constitutional:      General: He is in acute distress.     Appearance: He is well-developed.     Comments: Patient appears uncomfortable, pacing in room.  He has been up to the restroom, tried to urinate, but still cannot.  HENT:     Head: Normocephalic and atraumatic.  Eyes:     Conjunctiva/sclera: Conjunctivae normal.  Pulmonary:     Effort: No respiratory distress.  Musculoskeletal:     Cervical back: Normal range of motion and neck supple.  Skin:    General: Skin is warm and dry.  Neurological:     Mental Status: He is alert.     (all labs ordered are listed, but only abnormal results are displayed) Labs Reviewed  URINALYSIS, ROUTINE W REFLEX MICROSCOPIC - Abnormal; Notable for the following components:  Result Value   Color, Urine STRAW (*)    Hgb urine dipstick MODERATE (*)    Protein, ur 30 (*)    Leukocytes,Ua SMALL (*)    All other components within normal limits  URINALYSIS, MICROSCOPIC (REFLEX) - Abnormal; Notable for the following components:   Bacteria, UA RARE (*)    All other components within normal limits  URINE CULTURE    EKG: None  Radiology: No results found.   Procedures   Medications Ordered in the ED  oxyCODONE -acetaminophen  (PERCOCET/ROXICET) 5-325 MG per tablet 1 tablet (1 tablet Oral Given 12/24/24 1652)  lidocaine  (XYLOCAINE ) 2 % jelly 1 Application (1 Application Urethral Given 12/24/24 1807)   ED  Course  Patient seen and examined. History obtained directly from patient and wife.  Clinical history suggestive of urinary retention that has recurred.  Will place catheter and check urinary output.  If signs of retention, will leave catheter in place.  Labs/EKG: Ordered UA.  Imaging: None ordered  Medications/Fluids: Ordered: Uro-Jet  Most recent vital signs reviewed and are as follows: BP (!) 155/88 (BP Location: Right Arm)   Pulse 93   Temp 98.2 F (36.8 C)   Resp 20   SpO2 97%   Initial impression: Lower abdominal pain, urge to urinate, urinary retention.  6:40 PM Reassessment performed. Patient appears improved.  He is having some paresthesias in his arms after placement of Foley catheter.  This is not typical for his seizure disorder.  I asked RN to replace BP cuff.  Recheck blood pressure.  Labs personally reviewed and interpreted including: UA with 21-50 white cells per high-power field.  Culture sent.  Reviewed pertinent lab work and imaging with patient at bedside. Questions answered.   Most current vital signs reviewed and are as follows: BP (!) 155/88 (BP Location: Right Arm)   Pulse 93   Temp 98.2 F (36.8 C)   Resp 20   SpO2 97%   Plan: Will plan to recheck blood pressure.  Given white blood cells in urine, would prefer to restart antibiotics, covering for prostatitis.  Urine culture sent.  Patient has appropriate follow-up.  Will need to go home with Foley catheter in place.  Upstill PA-C aware at shift change.                                    Medical Decision Making Amount and/or Complexity of Data Reviewed Labs: ordered.  Risk Prescription drug management.   Patient with acute urinary retention, recurrent after recent voiding trial.  Question of ongoing UTI versus prostatitis.  Patient does not appear to be septic.  Symptoms much improved after placement of Foley catheter, greater than 900 cc of urine out.     Final diagnoses:  Urinary  retention    ED Discharge Orders     None         Desiderio Chew, PA-C 12/24/24 1845    Kammerer, Megan L, DO 12/27/24 1359  "

## 2024-12-24 NOTE — ED Provider Notes (Signed)
" °  Physical Exam  BP (!) 155/88 (BP Location: Right Arm)   Pulse 93   Temp 98.2 F (36.8 C)   Resp 20   SpO2 97%   Physical Exam  Procedures  Procedures  ED Course / MDM    Medical Decision Making Amount and/or Complexity of Data Reviewed Labs: ordered.  Risk Prescription drug management.   Pending UA, has foley for retention (had one, out 2 days ago, here today with recurrent retention) Angiogram tomorrow for shrinkage of prostate  UA showing rare bacteria, 21-50 WBCs. Consider Cipro  with prostate issues, BID x 14 days. Culture added to tests today. Encourage review of cultures with Dr. Selma (urology) when available.  7:50 - recheck: no further paresthesias. Feels much better. Discussed plan for discharge. Patient and spouse comfortable with going home to outpatient follow up.       Odell Balls, PA-C 12/24/24 1957    Kammerer, Megan L, DO 12/27/24 1358  "

## 2024-12-25 ENCOUNTER — Ambulatory Visit
Admission: RE | Admit: 2024-12-25 | Discharge: 2024-12-25 | Disposition: A | Discharge status: Home / Self Care | Source: Ambulatory Visit | Attending: Urology | Admitting: Urology

## 2024-12-25 DIAGNOSIS — N401 Enlarged prostate with lower urinary tract symptoms: Secondary | ICD-10-CM

## 2024-12-25 LAB — URINE CULTURE: Culture: NO GROWTH

## 2024-12-25 MED ORDER — IOPAMIDOL (ISOVUE-370) INJECTION 76%
75.0000 mL | Freq: Once | INTRAVENOUS | Status: AC | PRN
  Administered 2024-12-25: 75 mL via INTRAVENOUS

## 2025-01-08 ENCOUNTER — Other Ambulatory Visit: Payer: Self-pay | Admitting: Nurse Practitioner

## 2025-01-08 DIAGNOSIS — R338 Other retention of urine: Secondary | ICD-10-CM

## 2025-01-15 ENCOUNTER — Ambulatory Visit: Admission: RE | Admit: 2025-01-15 | Source: Ambulatory Visit

## 2025-01-15 DIAGNOSIS — R338 Other retention of urine: Secondary | ICD-10-CM

## 2025-01-15 NOTE — H&P (Addendum)
 "   Reason for visit BPH with LUTS. Prostate embolization. Foley catheter in place.   Care Team(s) Primary Care; Varadarajan, Rupashree, MD Neurology; Gregg Lek, MD Urology; Selma Cough MD and Baldwin Ubaldo Motto NP  History of Present Illness: [name] is a 69 y.o. male with PMH significant for seizures and BPH (s/p prostate bx, s/p Urolift x 6 implants in 2018). Recently, he has become more symptomatic, now with intermittent hematuria and urinary retention requiring Foley placement 11/2024. He did attempt voiding trial in January but returned to ED a few days after with recurrent urinary retention. He is followed by [facility] and discussed interest in PAE at last visit, prompting IR referral. A CTA was ordered in preparation for consult and was completed 12/25/24. Prostate gland 73g. He has continued with Foley and with taking alfuzosin, with urologist planning to re attempt voiding trial post PAE.   He is joined today by his spouse, Jeffrey [name], who is a reliable historian. He is on medical management with alfuzosin 10 mg QD. ROS positive for multiple UTIs. He denies hematuria.  I used a International Prostatism Symptom Score* (IPSS) Score to quantify his symptoms : 33 points (Severe symptoms)   *Wadie MJ, Ashley GARNELL Mickey Valorie MP, et al. The American Urological Association symptom index for benign prostatic hyperplasia. The Measurement Committee of the American Urological Association. J Urol [id number]; 851:8450.   Review of Systems: A 12 point ROS discussed and pertinent positives are indicated in the HPI above.  All other systems are negative.   IPSS Questionnaire (AUA-7): Over the past month   1)  How often have you had a sensation of not emptying your bladder completely after you finish urinating?  5 - Almost always  2)  How often have you had to urinate again less than two hours after you finished urinating? 5 - Almost always  3)  How often have you found you  stopped and started again several times when you urinated?  5 - Almost always  4) How difficult have you found it to postpone urination?  5 - Almost always  5) How often have you had a weak urinary stream?  5 - Almost always  6) How often have you had to push or strain to begin urination?  5 - Almost always  7) How many times did you most typically get up to urinate from the time you went to bed until the time you got up in the morning?  3 - 3 times  Total score:  0-7 mildly symptomatic   8-19 moderately symptomatic  33 20-35 severely symptomatic      Past Medical History:  Diagnosis Date   ADD (attention deficit disorder)    Benign prostatic hypertrophy    Depression    Seizures (HCC)     Past Surgical History:  Procedure Laterality Date   APPENDECTOMY     IR RADIOLOGIST EVAL & MGMT  01/15/2025    Allergies: Patient has no known allergies.  Medications: Prior to Admission medications  Medication Sig Start Date End Date Taking? Authorizing Provider  alfuzosin (UROXATRAL) 10 MG 24 hr tablet Take 10 mg by mouth daily.    [provider]  amphetamine -dextroamphetamine  (ADDERALL) 20 MG tablet Take 10 mg by mouth 3 (three) times daily.     [provider]  atorvastatin (LIPITOR) 20 MG tablet Take 20 mg at bedtime by mouth. 10/23/17   [provider]  ciprofloxacin  (CIPRO ) 500 MG tablet Take 1  tablet (500 mg total) by mouth every 12 (twelve) hours. 12/24/24   Odell Balls, PA-C  citalopram  (CELEXA ) 40 MG tablet Take 1 tablet by mouth at bedtime.  08/19/15   [provider]  Multiple Vitamin (MULTIVITAMIN WITH MINERALS) TABS Take 1 tablet by mouth daily.    [provider]  phenytoin  (DILANTIN ) 100 MG ER capsule 100 mg in the morning and 200 mg at bedtime 03/05/24   Millikan, Megan, NP  phenytoin  (PHENYTOIN  INFATABS) 50 MG tablet CHEW AND SWALLOW 1 TABLET BY MOUTH EVERY DAY 03/05/24   Millikan, Megan, NP  propranolol (INDERAL) 10 MG tablet 1  tablet Orally Once a day, as needed for 30 days Patient not taking: Reported on 03/05/2024 02/21/23   [provider]     Family History  Problem Relation Age of Onset   Prostate cancer Father    Seizures Father    Heart Problems Mother    Stroke Sister    Seizures Son     Social History   Socioeconomic History   Marital status: Married    Spouse name: Not on file   Number of children: 2   Years of education: Not on file   Highest education level: Not on file  Occupational History    Employer: [employer]  Tobacco Use   Smoking status: Former    Types: Cigarettes   Smokeless tobacco: Never   Tobacco comments:    Quit 1977  Substance and Sexual Activity   Alcohol use: Yes    Comment: rarely   Drug use: No   Sexual activity: Not on file  Other Topics Concern   Not on file  Social History Narrative   Patient is divorced and works for [employer] and [employer].    Education- college   Left handed.   Caffeine- None   Social Drivers of Health   Tobacco Use: Medium Risk (12/24/2024)   Patient History    Smoking Tobacco Use: Former    Smokeless Tobacco Use: Never    Passive Exposure: Not on Actuary Strain: Not on file  Food Insecurity: Not on file  Transportation Needs: Not on file  Physical Activity: Not on file  Stress: Not on file  Social Connections: Not on file  Depression (PHQ2-9): Not on file  Alcohol Screen: Not on file  Housing: Not on file  Utilities: Not on file  Health Literacy: Not on file    Review of Systems As above  Vital Signs: BP (!) 145/72 (BP Location: Left Arm, Patient Position: Sitting, Cuff Size: Normal)   Pulse 89   Temp (!) 97.4 F (36.3 C) (Oral)   Resp 16   SpO2 96%   Physical Exam  General: WN, NAD  CV: RRR on monitor Pulm: normal work of breathing on RA Abd: S, ND, NT GU: Foley catheter in place MSK: Grossly normal Psych: Appropriate affect.  Imaging:      IR Radiologist Eval &  Mgmt Result Date: 01/15/2025 EXAM: NEW PATIENT OFFICE VISIT CHIEF COMPLAINT: See below HISTORY OF PRESENT ILLNESS: See below REVIEW OF SYSTEMS: See below PHYSICAL EXAMINATION: See below ASSESSMENT AND PLAN: Please refer to completed note in the electronic medical record on [place] [employer] Thom Hall, MD [facility] Radiology Electronically Signed   By: Thom Hall M.D.   On: 01/15/2025 11:48   CT ANGIO PELVIS W OR WO CONTRAST Result Date: 12/29/2024 CLINICAL DATA:  BPH with chronic urinary retention and Foley  catheter dependence. EXAM: CT ANGIOGRAPHY OF PELVIS TECHNIQUE: Multidetector CT imaging of the abdomen and pelvis was performed using the standard protocol during bolus administration of intravenous contrast. Multiplanar reconstructed images and MIPs were obtained and reviewed to evaluate the vascular anatomy. RADIATION DOSE REDUCTION: This exam was performed according to the departmental dose-optimization program which includes automated exposure control, adjustment of the mA and/or kV according to patient size and/or use of iterative reconstruction technique. CONTRAST:  75mL ISOVUE -370 IOPAMIDOL  (ISOVUE -370) INJECTION 76% COMPARISON:  CT abdomen/pelvis 12/11/2024 FINDINGS: VASCULAR Aorta: Normal caliber aorta without aneurysm, dissection, vasculitis or significant stenosis. IMA: Patent without evidence of aneurysm, dissection, vasculitis or significant stenosis. Inflow: Patent without evidence of aneurysm, dissection, vasculitis or significant stenosis. Proximal Outflow: Aberrant obturator arteries bilaterally. Both obturator arteries are replaced to the inferior epigastric arteries. On the right, the prostatic artery arises from the aberrant obturator artery. On the left, the prostatic artery appears to arise from a common trunk with the superior vesicular artery. There is collateralization with the replaced obturator artery distally. Widely patent iliac  system with minimal tortuosity. Mild scattered calcified atherosclerotic plaque. Veins: No obvious venous abnormality within the limitations of this arterial phase study. Review of the MIP images confirms the above findings. NON-VASCULAR Urinary Tract: The visualized ureters are unremarkable. A Foley catheter is present within the collapsed bladder. Stomach/Bowel: No focal bowel wall thickening or evidence of obstruction. Lymphatic: No enlarged abdominal or pelvic lymph nodes. Reproductive: Prostatomegaly. The prostate gland measures approximately 6.2 x 4.9 x 6.1 cm for a total volume of 73 cc. Other: No abdominal wall hernia or abnormality. No abdominopelvic ascites. Musculoskeletal: No acute or significant osseous findings. IMPRESSION: VASCULAR 1. 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 2. The right prostatic artery arises from the aberrant obturator artery. 3. Type 1 origin of the left prostatic artery which arises via a common trunk with the superior vesicular artery. There is collateralization between the prostatic artery distally in the aberrant left obturator artery. 4. Overall, minimal iliac tortuosity and no significant stenosis, occlusion, dissection or aneurysm. NON-VASCULAR 1. Prostatomegaly.  Prostate gland is approximately 73 g. 2. Foley catheter in the bladder. Electronically Signed   By: Wilkie Lent M.D.   On: 12/29/2024 16:06    Labs:  CBC: Recent Labs    03/05/24 1101 12/12/24 0209  WBC 4.6 6.0  HGB 14.0 12.2*  HCT 41.3 35.7*  PLT 180 177    COAGS: No results for input(s): INR, APTT in the last 8760 hours.  BMP: Recent Labs    03/05/24 1101 12/12/24 0209  NA 140 142  K 4.5 3.8  CL 102 106  CO2 23 26  GLUCOSE 83 105*  BUN 21 23  CALCIUM 9.5 9.2  CREATININE 0.89 0.89  GFRNONAA  --  >60     Assessment and Plan:  69 y/o M comorbid w PMHx significant for seizure d/o, long standing BPH s/p Urolift (10/2018) and  elevated PSA s/p negative prostate Bx (10/16/23). Worsening bladder outlet obstruction w 3 UTIs reported since 06/2024 and repeat ER visits for acute obstruction requiring catheterization. Pt has been foley catheter dependent since 01/05/25.  Prostatomegaly (~140 mL) PSA, last 8.3 (10/2024) IPSS Score Total, 33 points (Severe symptoms) QoL terrible, on alfuzosin 10 mg QD.    Given the nature of the disease, I felt that the patient would benefit from Prostate Artery Embolization (PAE).  The procedure has been fully reviewed with the patient/patients authorized representative. The risks, benefits  and alternatives of PAE were fully discussed including technique, potential complications, timeline of improvement, efficacy and durability. The patient/patients authorized representative has consented to the procedure.   *CTA Pelvis (12/25/24) reviewed. No additional imaging required. *OK to proceed to schedule PAE on mutual availability.  Expedite request, given current foley catheterization. *Same day procedure, no overnight admission. Procedure to be performed at [facility] *courtesy foley catheter exchange on procedure day. *Pt habitus >6 ft, will plan for trans-femoral access. *Rocephin  for pre op Abx *CBC, BMP, Coags + PSA to be drawn on procedure day  Thank you for this interesting consult.  I greatly enjoyed meeting JAHEIM CANINO and look forward to participating in their care.  A copy of this report was sent to the requesting provider on this date.  Electronically Signed:   Thom Hall, MD [facility] Radiology   Pager. [phone] Clinic. [phone]  I spent a total of 45 Minutes  in face to face in clinical consultation, greater than 50% of which was counseling/coordinating care for Mr EDDRICK DILONE BPH and urinary retention  "

## 2025-01-19 ENCOUNTER — Other Ambulatory Visit (HOSPITAL_COMMUNITY): Payer: Self-pay | Admitting: Interventional Radiology

## 2025-01-19 ENCOUNTER — Other Ambulatory Visit: Payer: Self-pay | Admitting: Radiology

## 2025-01-19 DIAGNOSIS — R339 Retention of urine, unspecified: Secondary | ICD-10-CM

## 2025-01-19 NOTE — H&P (Signed)
 "    Chief Complaint: benign prostatic hyperplasia with lower urinary tract symptoms; referred for bilateral prostate artery embolization, foley cath exchange  Referring Provider(s): Baldwin Ubaldo Motto, NP (urology). Gay,M  Supervising Physician: Hughes Simmonds  Patient Status: [name] - Out-pt  History of Present Illness: Jeffrey [name] is a 69 y.o. male with past medical history significant for seizures, ADD, depression,  and benign prostatic hyperplasia (s/p prostate biopsy, s/p Urolift x6 implants in 2018). He has recently become more symptomatic with intermittent hematuria and urinary retention which required foley placement in December 2025. Voiding trial earlier this month was unsuccessful as he returned to the ED with urinary retention. Patient has expressed interest to his urology team in prostate artery embolization (PAE) which initiated IR referral. CTA was ordered in preparation for consult which was completed on 12/25/2024 and revealed prostatomegaly. Patient was seen in consult for PAE with Dr. Hughes on 01/15/2025. He presents today to proceed with PAE  procedure as well as foley catheter exchange.    Allergies Reviewed:  Patient has no known allergies.   Patient is Full Code  Past Medical History:  Diagnosis Date   ADD (attention deficit disorder)    Benign prostatic hypertrophy    Depression    Seizures (HCC)     Past Surgical History:  Procedure Laterality Date   APPENDECTOMY     IR RADIOLOGIST EVAL & MGMT  01/15/2025      Medications: Prior to Admission medications  Medication Sig Start Date End Date Taking? Authorizing Provider  alfuzosin (UROXATRAL) 10 MG 24 hr tablet Take 10 mg by mouth daily.    [provider]  amphetamine -dextroamphetamine  (ADDERALL) 20 MG tablet Take 10 mg by mouth 3 (three) times daily.     [provider]  atorvastatin (LIPITOR) 20 MG tablet Take 20 mg at bedtime by mouth. 10/23/17   [provider]   ciprofloxacin  (CIPRO ) 500 MG tablet Take 1 tablet (500 mg total) by mouth every 12 (twelve) hours. 12/24/24   Odell Balls, PA-C  citalopram  (CELEXA ) 40 MG tablet Take 1 tablet by mouth at bedtime.  08/19/15   [provider]  Multiple Vitamin (MULTIVITAMIN WITH MINERALS) TABS Take 1 tablet by mouth daily.    [provider]  phenytoin  (DILANTIN ) 100 MG ER capsule 100 mg in the morning and 200 mg at bedtime 03/05/24   Millikan, Megan, NP  phenytoin  (PHENYTOIN  INFATABS) 50 MG tablet CHEW AND SWALLOW 1 TABLET BY MOUTH EVERY DAY 03/05/24   Millikan, Megan, NP  propranolol (INDERAL) 10 MG tablet 1 tablet Orally Once a day, as needed for 30 days Patient not taking: Reported on 03/05/2024 02/21/23   [provider]     Family History  Problem Relation Age of Onset   Prostate cancer Father    Seizures Father    Heart Problems Mother    Stroke Sister    Seizures Son     Social History   Socioeconomic History   Marital status: Married    Spouse name: Not on file   Number of children: 2   Years of education: Not on file   Highest education level: Not on file  Occupational History    Employer: [employer]  Tobacco Use   Smoking status: Former    Types: Cigarettes   Smokeless tobacco: Never   Tobacco comments:    Quit 1977  Substance and Sexual Activity   Alcohol use: Yes    Comment: rarely   Drug  use: No   Sexual activity: Not on file  Other Topics Concern   Not on file  Social History Narrative   Patient is divorced and works for [employer].    Education- college   Left handed.   Caffeine- None   Social Drivers of Health   Tobacco Use: Medium Risk (12/24/2024)   Patient History    Smoking Tobacco Use: Former    Smokeless Tobacco Use: Never    Passive Exposure: Not on Actuary Strain: Not on file  Food Insecurity: Not on file  Transportation Needs: Not on file  Physical Activity: Not on file  Stress: Not on file   Social Connections: Not on file  Depression (PHQ2-9): Not on file  Alcohol Screen: Not on file  Housing: Not on file  Utilities: Not on file  Health Literacy: Not on file     ROS: currently denies fever,HA,CP,dyspnea, cough, abd pain,N/V ; he does have some mild back pain, occ blood at urethral meatus secondary to foley    Vital Signs:pending    Advance Care Plan: no documents on file    Physical Exam: awake/alert; chest- CTA bilat; heart- RRR; abd-soft,+BS,NT; no LE edema; foley cath draining yellow urine  Imaging: IR Radiologist Eval & Mgmt Result Date: 01/15/2025 EXAM: NEW PATIENT OFFICE VISIT CHIEF COMPLAINT: See below HISTORY OF PRESENT ILLNESS: See below REVIEW OF SYSTEMS: See below PHYSICAL EXAMINATION: See below ASSESSMENT AND PLAN: Please refer to completed note in the electronic medical record on [place] [employer] Thom Hall, MD [facility] Radiology Electronically Signed   By: Thom Hall M.D.   On: 01/15/2025 11:48   CT ANGIO PELVIS W OR WO CONTRAST Result Date: 12/29/2024 CLINICAL DATA:  BPH with chronic urinary retention and Foley catheter dependence. EXAM: CT ANGIOGRAPHY OF PELVIS TECHNIQUE: Multidetector CT imaging of the abdomen and pelvis was performed using the standard protocol during bolus administration of intravenous contrast. Multiplanar reconstructed images and MIPs were obtained and reviewed to evaluate the vascular anatomy. RADIATION DOSE REDUCTION: This exam was performed according to the departmental dose-optimization program which includes automated exposure control, adjustment of the mA and/or kV according to patient size and/or use of iterative reconstruction technique. CONTRAST:  75mL ISOVUE -370 IOPAMIDOL  (ISOVUE -370) INJECTION 76% COMPARISON:  CT abdomen/pelvis 12/11/2024 FINDINGS: VASCULAR Aorta: Normal caliber aorta without aneurysm, dissection, vasculitis or significant stenosis. IMA: Patent without  evidence of aneurysm, dissection, vasculitis or significant stenosis. Inflow: Patent without evidence of aneurysm, dissection, vasculitis or significant stenosis. Proximal Outflow: Aberrant obturator arteries bilaterally. Both obturator arteries are replaced to the inferior epigastric arteries. On the right, the prostatic artery arises from the aberrant obturator artery. On the left, the prostatic artery appears to arise from a common trunk with the superior vesicular artery. There is collateralization with the replaced obturator artery distally. Widely patent iliac system with minimal tortuosity. Mild scattered calcified atherosclerotic plaque. Veins: No obvious venous abnormality within the limitations of this arterial phase study. Review of the MIP images confirms the above findings. NON-VASCULAR Urinary Tract: The visualized ureters are unremarkable. A Foley catheter is present within the collapsed bladder. Stomach/Bowel: No focal bowel wall thickening or evidence of obstruction. Lymphatic: No enlarged abdominal or pelvic lymph nodes. Reproductive: Prostatomegaly. The prostate gland measures approximately 6.2 x 4.9 x 6.1 cm for a total volume of 73 cc. Other: No abdominal wall hernia or abnormality. No abdominopelvic ascites. Musculoskeletal: No acute or significant osseous findings. IMPRESSION: VASCULAR 1. Bilateral aberrant  obturator arteries arising from the inferior epigastric arteries. 2. The right prostatic artery arises from the aberrant obturator artery. 3. Type 1 origin of the left prostatic artery which arises via a common trunk with the superior vesicular artery. There is collateralization between the prostatic artery distally in the aberrant left obturator artery. 4. Overall, minimal iliac tortuosity and no significant stenosis, occlusion, dissection or aneurysm. NON-VASCULAR 1. Prostatomegaly.  Prostate gland is approximately 73 g. 2. Foley catheter in the bladder. Electronically Signed   By: Wilkie Lent M.D.   On: 12/29/2024 16:06    Labs:  CBC: Recent Labs    03/05/24 1101 12/12/24 0209  WBC 4.6 6.0  HGB 14.0 12.2*  HCT 41.3 35.7*  PLT 180 177    COAGS: No results for input(s): INR, APTT in the last 8760 hours.  BMP: Recent Labs    03/05/24 1101 12/12/24 0209  NA 140 142  K 4.5 3.8  CL 102 106  CO2 23 26  GLUCOSE 83 105*  BUN 21 23  CALCIUM 9.5 9.2  CREATININE 0.89 0.89  GFRNONAA  --  >60    LIVER FUNCTION TESTS: Recent Labs    03/05/24 1101  BILITOT 0.3  AST 34  ALT 44  ALKPHOS 52  PROT 7.3  ALBUMIN 4.7    TUMOR MARKERS: No results for input(s): AFPTM, CEA, CA199, CHROMGRNA in the last 8760 hours.  Assessment and Plan: Benign prostatic hyperplasia with lower urinary tract symptoms; underwent consultation with Dr. Hughes on 01/15/25 and deemed an appropriate candidate for PAE. Scheduled today for procedure along with foley cath exchange.  No contraindications for procedure identified in ROS, physical exam, or review of pre-sedation considerations.   The procedure has been fully reviewed with the patient/patients authorized representative. The risks, benefits and alternatives of PAE were fully discussed including technique, potential complications, timeline of improvement, efficacy and durability. The patient/patients authorized representative has consented to the procedure.    Thank you for allowing our service to participate in HURSHEL BOUILLON 's care.    Electronically Signed: Kristi B Davenport, NP [facility] Kelsie RIGGERS  01/19/2025, 2:31 PM     I spent a total of 30 Minutes in face to face in clinical consultation, greater than 50% of which was counseling/coordinating care for image guided prostate artery embolization.    (A copy of this note was sent to the referring provider and the time of visit.)  "

## 2025-01-20 ENCOUNTER — Other Ambulatory Visit (HOSPITAL_COMMUNITY): Payer: Self-pay | Admitting: Interventional Radiology

## 2025-01-20 ENCOUNTER — Ambulatory Visit (HOSPITAL_COMMUNITY)
Admission: RE | Admit: 2025-01-20 | Discharge: 2025-01-20 | Disposition: A | Discharge status: Home / Self Care | Source: Ambulatory Visit | Attending: Interventional Radiology | Admitting: Interventional Radiology

## 2025-01-20 ENCOUNTER — Encounter (HOSPITAL_COMMUNITY): Payer: Self-pay

## 2025-01-20 ENCOUNTER — Other Ambulatory Visit: Payer: Self-pay

## 2025-01-20 ENCOUNTER — Other Ambulatory Visit (HOSPITAL_COMMUNITY): Payer: Self-pay

## 2025-01-20 ENCOUNTER — Inpatient Hospital Stay (HOSPITAL_COMMUNITY): Admission: RE | Admit: 2025-01-20 | Discharge: 2025-01-20 | Attending: Interventional Radiology

## 2025-01-20 VITALS — BP 126/75 | HR 74 | Temp 97.9°F | Resp 16 | Ht 73.0 in | Wt 192.0 lb

## 2025-01-20 DIAGNOSIS — R339 Retention of urine, unspecified: Secondary | ICD-10-CM

## 2025-01-20 DIAGNOSIS — R338 Other retention of urine: Secondary | ICD-10-CM | POA: Insufficient documentation

## 2025-01-20 DIAGNOSIS — Z8669 Personal history of other diseases of the nervous system and sense organs: Secondary | ICD-10-CM | POA: Insufficient documentation

## 2025-01-20 DIAGNOSIS — N401 Enlarged prostate with lower urinary tract symptoms: Secondary | ICD-10-CM | POA: Diagnosis present

## 2025-01-20 DIAGNOSIS — Z87891 Personal history of nicotine dependence: Secondary | ICD-10-CM | POA: Diagnosis not present

## 2025-01-20 LAB — CBC WITH DIFFERENTIAL/PLATELET
Abs Immature Granulocytes: 0.01 10*3/uL (ref 0.00–0.07)
Basophils Absolute: 0 10*3/uL (ref 0.0–0.1)
Basophils Relative: 1 %
Eosinophils Absolute: 0.3 10*3/uL (ref 0.0–0.5)
Eosinophils Relative: 4 %
HCT: 40.4 % (ref 39.0–52.0)
Hemoglobin: 13.9 g/dL (ref 13.0–17.0)
Immature Granulocytes: 0 %
Lymphocytes Relative: 28 %
Lymphs Abs: 1.6 10*3/uL (ref 0.7–4.0)
MCH: 33.4 pg (ref 26.0–34.0)
MCHC: 34.4 g/dL (ref 30.0–36.0)
MCV: 97.1 fL (ref 80.0–100.0)
Monocytes Absolute: 0.4 10*3/uL (ref 0.1–1.0)
Monocytes Relative: 7 %
Neutro Abs: 3.4 10*3/uL (ref 1.7–7.7)
Neutrophils Relative %: 60 %
Platelets: 204 10*3/uL (ref 150–400)
RBC: 4.16 MIL/uL — ABNORMAL LOW (ref 4.22–5.81)
RDW: 12.8 % (ref 11.5–15.5)
WBC: 5.7 10*3/uL (ref 4.0–10.5)
nRBC: 0 % (ref 0.0–0.2)

## 2025-01-20 LAB — BASIC METABOLIC PANEL WITH GFR
Anion gap: 9 (ref 5–15)
BUN: 20 mg/dL (ref 8–23)
CO2: 26 mmol/L (ref 22–32)
Calcium: 9.9 mg/dL (ref 8.9–10.3)
Chloride: 104 mmol/L (ref 98–111)
Creatinine, Ser: 0.94 mg/dL (ref 0.61–1.24)
GFR, Estimated: >60 mL/min
Glucose, Bld: 97 mg/dL (ref 70–99)
Potassium: 4.2 mmol/L (ref 3.5–5.1)
Sodium: 139 mmol/L (ref 135–145)

## 2025-01-20 LAB — PROTIME-INR
INR: 1 (ref 0.8–1.2)
Prothrombin Time: 13.9 s (ref 11.4–15.2)

## 2025-01-20 LAB — PSA: Prostatic Specific Antigen: 9.13 ng/mL — ABNORMAL HIGH (ref 0.00–4.00)

## 2025-01-20 MED ORDER — MIDAZOLAM HCL (PF) 2 MG/2ML IJ SOLN
INTRAMUSCULAR | Status: AC | PRN
  Administered 2025-01-20: 1 mg via INTRAVENOUS

## 2025-01-20 MED ORDER — CIPROFLOXACIN HCL 500 MG PO TABS
500.0000 mg | ORAL_TABLET | Freq: Two times a day (BID) | ORAL | 0 refills | Status: AC
  Filled 2025-01-20: qty 14, 7d supply, fill #0

## 2025-01-20 MED ORDER — IOHEXOL 300 MG/ML  SOLN
250.0000 mL | Freq: Once | INTRAMUSCULAR | Status: AC | PRN
  Administered 2025-01-20: 118 mL via INTRA_ARTERIAL

## 2025-01-20 MED ORDER — DIPHENHYDRAMINE HCL 50 MG/ML IJ SOLN
INTRAMUSCULAR | Status: AC
  Filled 2025-01-20: qty 1

## 2025-01-20 MED ORDER — DIPHENHYDRAMINE HCL 50 MG/ML IJ SOLN
INTRAMUSCULAR | Status: AC | PRN
  Administered 2025-01-20 (×2): 25 mg via INTRAVENOUS

## 2025-01-20 MED ORDER — LIDOCAINE HCL 1 % IJ SOLN
20.0000 mL | Freq: Once | INTRAMUSCULAR | Status: AC
  Administered 2025-01-20: 10 mL via INTRADERMAL

## 2025-01-20 MED ORDER — SODIUM CHLORIDE 0.9 % IV SOLN: INTRAVENOUS | Status: DC

## 2025-01-20 MED ORDER — LIDOCAINE HCL 1 % IJ SOLN
INTRAMUSCULAR | Status: AC
  Filled 2025-01-20: qty 20

## 2025-01-20 MED ORDER — SOLIFENACIN SUCCINATE 5 MG PO TABS
5.0000 mg | ORAL_TABLET | Freq: Every day | ORAL | 0 refills | Status: AC
  Filled 2025-01-20: qty 7, 7d supply, fill #0

## 2025-01-20 MED ORDER — SODIUM CHLORIDE 0.9 % IV SOLN
2.0000 g | Freq: Once | INTRAVENOUS | Status: AC
  Administered 2025-01-20: 2 g via INTRAVENOUS

## 2025-01-20 MED ORDER — FENTANYL CITRATE (PF) 100 MCG/2ML IJ SOLN
INTRAMUSCULAR | Status: AC
  Filled 2025-01-20: qty 2

## 2025-01-20 MED ORDER — OXYCODONE HCL 5 MG PO TABS
10.0000 mg | ORAL_TABLET | ORAL | Status: DC | PRN
  Administered 2025-01-20: 5 mg via ORAL
  Filled 2025-01-20: qty 2

## 2025-01-20 MED ORDER — NITROGLYCERIN IN D5W 100-5 MCG/ML-% IV SOLN
INTRAVENOUS | Status: AC
  Filled 2025-01-20: qty 250

## 2025-01-20 MED ORDER — METHYLPREDNISOLONE 4 MG PO TBPK
ORAL_TABLET | ORAL | 0 refills | Status: AC
  Filled 2025-01-20: qty 21, 6d supply, fill #0

## 2025-01-20 MED ORDER — KETOROLAC TROMETHAMINE 30 MG/ML IJ SOLN
INTRAMUSCULAR | Status: AC
  Filled 2025-01-20: qty 1

## 2025-01-20 MED ORDER — FENTANYL CITRATE (PF) 100 MCG/2ML IJ SOLN
INTRAMUSCULAR | Status: AC | PRN
  Administered 2025-01-20: 25 ug via INTRAVENOUS

## 2025-01-20 MED ORDER — MIDAZOLAM HCL (PF) 2 MG/2ML IJ SOLN
INTRAMUSCULAR | Status: AC | PRN
  Administered 2025-01-20: .5 mg via INTRAVENOUS
  Administered 2025-01-20: 1 mg via INTRAVENOUS
  Administered 2025-01-20: .5 mg via INTRAVENOUS

## 2025-01-20 MED ORDER — KETOROLAC TROMETHAMINE 15 MG/ML IJ SOLN
INTRAMUSCULAR | Status: AC | PRN
  Administered 2025-01-20: 15 mg via INTRAVENOUS

## 2025-01-20 MED ORDER — HEPARIN SODIUM (PORCINE) 1000 UNIT/ML IJ SOLN
INTRAMUSCULAR | Status: AC
  Filled 2025-01-20: qty 10

## 2025-01-20 MED ORDER — SODIUM CHLORIDE 0.9 % IV SOLN
INTRAVENOUS | Status: AC
  Filled 2025-01-20: qty 20

## 2025-01-20 MED ORDER — MIDAZOLAM HCL 2 MG/2ML IJ SOLN
INTRAMUSCULAR | Status: AC
  Filled 2025-01-20: qty 2

## 2025-01-20 MED ORDER — PHENAZOPYRIDINE HCL 100 MG PO TABS
100.0000 mg | ORAL_TABLET | Freq: Three times a day (TID) | ORAL | 0 refills | Status: AC | PRN
  Filled 2025-01-20: qty 21, 7d supply, fill #0

## 2025-01-20 MED ORDER — FENTANYL CITRATE (PF) 100 MCG/2ML IJ SOLN
INTRAMUSCULAR | Status: AC | PRN
  Administered 2025-01-20: 50 ug via INTRAVENOUS
  Administered 2025-01-20: 25 ug via INTRAVENOUS

## 2025-01-20 MED ORDER — NITROGLYCERIN 1 MG/10 ML FOR IR/CATH LAB
INTRA_ARTERIAL | Status: AC | PRN
  Administered 2025-01-20: 200 ug via INTRA_ARTERIAL

## 2025-01-20 NOTE — Discharge Instructions (Signed)
 Please call Interventional Radiology clinic [phone] with any questions or concerns.  You may remove your dressing and shower tomorrow.  After the procedure, it is common to have: Increased frequency of urination Mild pressure or discomfort in the low belly Blood in the urine and/or painful urination for 7-14 days  Blood in the semen for 7-21 days Pressure, pain, or the urge to have a bowel movement when urinating and/or a small amount of blood in the stool for 4-7 days Low-grade fever (temperature less than 101.5 degrees Fahrenheit or 38.6 degrees Celsius) for 4-7 days Tenderness and/or bruising around the puncture site. Bruising can take 2-3 weeks to go away completely. Please call immediately if you feel a lump that is growing or varies with your pulse  Follow these instructions at home:  Medication: Do not use Aspirin or ibuprofen products, such as Advil or Motrin, as it may increase bleeding You may resume your usual medications as ordered by your doctor If your doctor prescribed antibiotics, take them as directed. Do not stop taking them just because you feel better  Eating and drinking: Drink plenty of liquids to keep your urine pale yellow You can resume your regular diet as directed by your doctor   Activity For procedures where we entered your artery from the top of your leg (groin):  Avoid strenuous activity, such as climbing long flights of stairs, for 24 hours after your procedure  No heavy lifting (greater than 15-20 pounds or 6-9 kg) for 7 days or until the puncture site heels Do not take baths, swim, or use a hot tub until your health care provider approves. Take showers only Keep all follow-up visits as told by your doctor  Care of the procedure site Follow instructions from your health care provider about how to take care of the puncture site.  Wash your hands with soap and water before you change your bandage (dressing). If soap and water are not available, use  hand sanitizer Change your dressing as told by your health care provider Leave stitches (sutures), skin glue, or adhesive strips in place. These skin closures may need to stay in place for 2 weeks or longer. If adhesive strip edges start to loosen and curl up, you may trim the loose edges. Do not remove adhesive strips completely unless your health care provider tells you to do that Check your puncture site every day for signs of infection. Check for: More redness, swelling, or pain Warmth/heat at puncture site Pus or a bad odor  Contact a health care provider if: You have a fever You have more redness, swelling, or pain around your incision You have more fluid or blood coming from your incision site Your incision feels warm to the touch You have pus or a bad smell coming from your incision You have a rash You have nausea, or you cannot eat or drink anything without vomiting  Get help right away if: You have trouble breathing You have chest pain You have severe pain in your abdomen, and it does not get better with medicine You have leg pain or leg swelling You feel dizzy, or you faint   Moderate Conscious Sedation-Care After  This sheet gives you information about how to care for yourself after your procedure. Your health care provider may also give you more specific instructions. If you have problems or questions, contact your health care provider.  After the procedure, it is common to have: Sleepiness for several hours. Impaired judgment for several hours.  Difficulty with balance. Vomiting if you eat too soon.  Follow these instructions at home:  Rest. Do not participate in activities where you could fall or become injured. Do not drive or use machinery. Do not drink alcohol. Do not take sleeping pills or medicines that cause drowsiness. Do not make important decisions or sign legal documents. Do not take care of children on your own.  Eating and drinking Follow the  diet recommended by your health care provider. Drink enough fluid to keep your urine pale yellow. If you vomit: Drink water, juice, or soup when you can drink without vomiting. Make sure you have little or no nausea before eating solid foods.  General instructions Take over-the-counter and prescription medicines only as told by your health care provider. Have a responsible adult stay with you for the time you are told. It is important to have someone help care for you until you are awake and alert. Do not smoke. Keep all follow-up visits as told by your health care provider. This is important.  Contact a health care provider if: You are still sleepy or having trouble with balance after 24 hours. You feel light-headed. You keep feeling nauseous or you keep vomiting. You develop a rash. You have a fever. You have redness or swelling around the IV site.  Get help right away if: You have trouble breathing. You have new-onset confusion at home.  This information is not intended to replace advice given to you by your health care provider. Make sure you discuss any questions you have with your healthcare provider.

## 2025-01-20 NOTE — Sedation Documentation (Signed)
 RN Augustin pulled 4 mg Versed  and 100 mcg Fentanyl  in IR room. Pt. Received 4 mg Versed  and 100 mcg Fentanyl  throughout the procedure.

## 2025-01-20 NOTE — Procedures (Signed)
 Vascular and Interventional Radiology Procedure Note  Patient: Jeffrey [name] DOB: October 10, 1956 Medical Record Number: [id number] Note Date/Time: 01/20/25 1:58 PM   Performing Physician: Thom Hall, MD Assistant(s): None  Diagnosis: BPH w LUTS. Foley catheter dependent   Procedure(s):  PELVIC ARTERIOGRAPHY RIGHT PROSTATE ARTERY EMBOLIZATION   Anesthesia: Conscious Sedation Complications: None Estimated Blood Loss: Minimal Specimens: None  Findings:  - access via the RIGHT femoral artery. - tortuous prostatic arteries. - Successful R prostatic embolization with 100-300 um and 300-500 um microparticles to near stasis, with additional coil embolization of the R prostatic artery origins. - Vasospasm at L prostatic artery origin, shared with superior vesicular artery. Unable to cannulate. - AngioSeal closure at the R groin with distal RLE pulses at the end of the case.  Plan: - Post sheath removal precautions.  - Bedrest with RLE straight x2hrs.  Final report to follow once all images are reviewed and compared with previous studies.  See detailed dictation with images in PACS. The patient tolerated the procedure well without incident or complication and was returned to Recovery in stable condition.    Thom Hall, MD [facility]   Pager. [phone] Clinic. [phone]

## 2025-01-21 ENCOUNTER — Telehealth: Payer: Self-pay

## 2025-01-21 NOTE — Telephone Encounter (Signed)
 Patients medication changed manufacturers. This change was okay'd by Duwaine Russell. I called [employer] to inform them the change in generic medication was okay.   Patient informed as well.
